# Patient Record
Sex: Male | Born: 1945 | Race: White | Hispanic: No | Marital: Married | Smoking: Current every day smoker
Health system: Southern US, Community
[De-identification: ages and names within clinical notes are randomized; demographics above are authoritative.]

## PROBLEM LIST (undated history)

## (undated) DIAGNOSIS — S42309A Unspecified fracture of shaft of humerus, unspecified arm, initial encounter for closed fracture: Secondary | ICD-10-CM

## (undated) DIAGNOSIS — S8290XA Unspecified fracture of unspecified lower leg, initial encounter for closed fracture: Secondary | ICD-10-CM

## (undated) DIAGNOSIS — S72001A Fracture of unspecified part of neck of right femur, initial encounter for closed fracture: Secondary | ICD-10-CM

## (undated) DIAGNOSIS — G8929 Other chronic pain: Secondary | ICD-10-CM

## (undated) HISTORY — DX: Fracture of unspecified part of neck of right femur, initial encounter for closed fracture: S72.001A

## (undated) HISTORY — PX: CHOLECYSTECTOMY: SHX55

## (undated) HISTORY — PX: ABDOMINAL SURGERY: SHX537

---

## 2004-12-02 ENCOUNTER — Ambulatory Visit (HOSPITAL_COMMUNITY): Admission: RE | Admit: 2004-12-02 | Discharge: 2004-12-02 | Payer: Self-pay | Admitting: Orthopedic Surgery

## 2005-02-02 ENCOUNTER — Ambulatory Visit (HOSPITAL_COMMUNITY): Admission: RE | Admit: 2005-02-02 | Discharge: 2005-02-03 | Payer: Self-pay | Admitting: Orthopedic Surgery

## 2014-04-23 ENCOUNTER — Encounter (HOSPITAL_COMMUNITY): Payer: Self-pay | Admitting: Emergency Medicine

## 2014-04-23 ENCOUNTER — Emergency Department (HOSPITAL_COMMUNITY): Payer: Medicare Other

## 2014-04-23 ENCOUNTER — Inpatient Hospital Stay (HOSPITAL_COMMUNITY)
Admission: EM | Admit: 2014-04-23 | Discharge: 2014-04-28 | DRG: 481 | Disposition: A | Discharge status: Skilled Nursing Facility | Payer: Medicare Other | Attending: General Surgery | Admitting: General Surgery

## 2014-04-23 DIAGNOSIS — IMO0002 Reserved for concepts with insufficient information to code with codable children: Secondary | ICD-10-CM

## 2014-04-23 DIAGNOSIS — Z789 Other specified health status: Secondary | ICD-10-CM

## 2014-04-23 DIAGNOSIS — S72143A Displaced intertrochanteric fracture of unspecified femur, initial encounter for closed fracture: Principal | ICD-10-CM | POA: Diagnosis present

## 2014-04-23 DIAGNOSIS — F411 Generalized anxiety disorder: Secondary | ICD-10-CM | POA: Diagnosis present

## 2014-04-23 DIAGNOSIS — T50905A Adverse effect of unspecified drugs, medicaments and biological substances, initial encounter: Secondary | ICD-10-CM

## 2014-04-23 DIAGNOSIS — M25559 Pain in unspecified hip: Secondary | ICD-10-CM | POA: Diagnosis present

## 2014-04-23 DIAGNOSIS — W11XXXA Fall on and from ladder, initial encounter: Secondary | ICD-10-CM | POA: Diagnosis present

## 2014-04-23 DIAGNOSIS — M1990 Unspecified osteoarthritis, unspecified site: Secondary | ICD-10-CM | POA: Diagnosis present

## 2014-04-23 DIAGNOSIS — G8929 Other chronic pain: Secondary | ICD-10-CM | POA: Diagnosis present

## 2014-04-23 DIAGNOSIS — D62 Acute posthemorrhagic anemia: Secondary | ICD-10-CM | POA: Diagnosis present

## 2014-04-23 DIAGNOSIS — S72009A Fracture of unspecified part of neck of unspecified femur, initial encounter for closed fracture: Secondary | ICD-10-CM

## 2014-04-23 DIAGNOSIS — S72001A Fracture of unspecified part of neck of right femur, initial encounter for closed fracture: Secondary | ICD-10-CM

## 2014-04-23 DIAGNOSIS — S72141A Displaced intertrochanteric fracture of right femur, initial encounter for closed fracture: Secondary | ICD-10-CM

## 2014-04-23 DIAGNOSIS — F17200 Nicotine dependence, unspecified, uncomplicated: Secondary | ICD-10-CM | POA: Diagnosis present

## 2014-04-23 DIAGNOSIS — R64 Cachexia: Secondary | ICD-10-CM | POA: Diagnosis present

## 2014-04-23 HISTORY — DX: Unspecified fracture of shaft of humerus, unspecified arm, initial encounter for closed fracture: S42.309A

## 2014-04-23 HISTORY — DX: Unspecified fracture of unspecified lower leg, initial encounter for closed fracture: S82.90XA

## 2014-04-23 HISTORY — DX: Other chronic pain: G89.29

## 2014-04-23 LAB — I-STAT CHEM 8, ED
BUN: 13 mg/dL (ref 6–23)
CHLORIDE: 99 meq/L (ref 96–112)
CREATININE: 0.9 mg/dL (ref 0.50–1.35)
Calcium, Ion: 1.2 mmol/L (ref 1.13–1.30)
Glucose, Bld: 141 mg/dL — ABNORMAL HIGH (ref 70–99)
HCT: 35 % — ABNORMAL LOW (ref 39.0–52.0)
Hemoglobin: 11.9 g/dL — ABNORMAL LOW (ref 13.0–17.0)
POTASSIUM: 4 meq/L (ref 3.7–5.3)
SODIUM: 136 meq/L — AB (ref 137–147)
TCO2: 26 mmol/L (ref 0–100)

## 2014-04-23 LAB — COMPREHENSIVE METABOLIC PANEL
ALT: 23 U/L (ref 0–53)
ANION GAP: 16 — AB (ref 5–15)
AST: 28 U/L (ref 0–37)
Albumin: 3.1 g/dL — ABNORMAL LOW (ref 3.5–5.2)
Alkaline Phosphatase: 140 U/L — ABNORMAL HIGH (ref 39–117)
BUN: 13 mg/dL (ref 6–23)
CHLORIDE: 97 meq/L (ref 96–112)
CO2: 24 meq/L (ref 19–32)
CREATININE: 0.87 mg/dL (ref 0.50–1.35)
Calcium: 8.7 mg/dL (ref 8.4–10.5)
GFR calc Af Amer: >90 mL/min (ref >90)
GFR calc non Af Amer: 87 mL/min — ABNORMAL LOW (ref >90)
Glucose, Bld: 140 mg/dL — ABNORMAL HIGH (ref 70–99)
Potassium: 4.1 mEq/L (ref 3.7–5.3)
Sodium: 137 mEq/L (ref 137–147)
Total Bilirubin: <0.2 mg/dL — ABNORMAL LOW (ref 0.3–1.2)
Total Protein: 6.9 g/dL (ref 6.0–8.3)

## 2014-04-23 LAB — CBC
HEMATOCRIT: 31.8 % — AB (ref 39.0–52.0)
HEMOGLOBIN: 10 g/dL — AB (ref 13.0–17.0)
MCH: 29.4 pg (ref 26.0–34.0)
MCHC: 31.4 g/dL (ref 30.0–36.0)
MCV: 93.5 fL (ref 78.0–100.0)
PLATELETS: 361 10*3/uL (ref 150–400)
RBC: 3.4 MIL/uL — AB (ref 4.22–5.81)
RDW: 14.8 % (ref 11.5–15.5)
WBC: 15.3 10*3/uL — AB (ref 4.0–10.5)

## 2014-04-23 LAB — ABO/RH: ABO/RH(D): O POS

## 2014-04-23 LAB — ETHANOL: Alcohol, Ethyl (B): <11 mg/dL (ref 0–11)

## 2014-04-23 LAB — MRSA PCR SCREENING: MRSA BY PCR: NEGATIVE

## 2014-04-23 LAB — PROTIME-INR
INR: 1.1 (ref 0.00–1.49)
Prothrombin Time: 14.2 seconds (ref 11.6–15.2)

## 2014-04-23 LAB — CDS SEROLOGY

## 2014-04-23 MED ORDER — PANTOPRAZOLE SODIUM 40 MG IV SOLR
40.0000 mg | Freq: Every day | INTRAVENOUS | Status: DC
  Administered 2014-04-23: 40 mg via INTRAVENOUS
  Filled 2014-04-23 (×2): qty 40

## 2014-04-23 MED ORDER — ONDANSETRON HCL 4 MG PO TABS: 4.0000 mg | ORAL_TABLET | Freq: Four times a day (QID) | ORAL | Status: DC | PRN

## 2014-04-23 MED ORDER — MORPHINE SULFATE 2 MG/ML IJ SOLN
2.0000 mg | INTRAMUSCULAR | Status: DC | PRN
  Administered 2014-04-23: 4 mg via INTRAVENOUS
  Filled 2014-04-23: qty 2

## 2014-04-23 MED ORDER — ACETAMINOPHEN 325 MG PO TABS: 650.0000 mg | ORAL_TABLET | ORAL | Status: DC | PRN

## 2014-04-23 MED ORDER — IOHEXOL 300 MG/ML  SOLN
80.0000 mL | Freq: Once | INTRAMUSCULAR | Status: AC | PRN
  Administered 2014-04-23: 80 mL via INTRAVENOUS

## 2014-04-23 MED ORDER — HYDROMORPHONE HCL PF 1 MG/ML IJ SOLN
0.5000 mg | Freq: Once | INTRAMUSCULAR | Status: AC
  Administered 2014-04-23: 0.5 mg via INTRAVENOUS
  Filled 2014-04-23: qty 1

## 2014-04-23 MED ORDER — PANTOPRAZOLE SODIUM 40 MG PO TBEC: 40.0000 mg | DELAYED_RELEASE_TABLET | Freq: Every day | ORAL | Status: DC

## 2014-04-23 MED ORDER — MORPHINE SULFATE 4 MG/ML IJ SOLN: 4.0000 mg | INTRAMUSCULAR | Status: DC | PRN

## 2014-04-23 MED ORDER — FENTANYL CITRATE 0.05 MG/ML IJ SOLN
50.0000 ug | Freq: Once | INTRAMUSCULAR | Status: AC
  Administered 2014-04-23: 50 ug via INTRAVENOUS
  Filled 2014-04-23: qty 2

## 2014-04-23 MED ORDER — LORAZEPAM 2 MG/ML IJ SOLN
1.0000 mg | Freq: Three times a day (TID) | INTRAMUSCULAR | Status: DC | PRN
  Administered 2014-04-24 (×2): 1 mg via INTRAVENOUS
  Filled 2014-04-23 (×2): qty 1

## 2014-04-23 MED ORDER — MORPHINE SULFATE 2 MG/ML IJ SOLN: 2.0000 mg | INTRAMUSCULAR | Status: DC | PRN

## 2014-04-23 MED ORDER — DEXTROSE 5 % IV SOLN
500.0000 mg | Freq: Four times a day (QID) | INTRAVENOUS | Status: DC | PRN
  Filled 2014-04-23: qty 5

## 2014-04-23 MED ORDER — ACETAMINOPHEN 10 MG/ML IV SOLN
1000.0000 mg | Freq: Four times a day (QID) | INTRAVENOUS | Status: DC
  Administered 2014-04-23 – 2014-04-24 (×2): 1000 mg via INTRAVENOUS
  Filled 2014-04-23 (×4): qty 100

## 2014-04-23 MED ORDER — ONDANSETRON HCL 4 MG/2ML IJ SOLN: 4.0000 mg | Freq: Four times a day (QID) | INTRAMUSCULAR | Status: DC | PRN

## 2014-04-23 MED ORDER — CEFAZOLIN SODIUM-DEXTROSE 2-3 GM-% IV SOLR
2.0000 g | Freq: Once | INTRAVENOUS | Status: AC
  Administered 2014-04-24: 2 g via INTRAVENOUS
  Filled 2014-04-23: qty 50

## 2014-04-23 MED ORDER — MORPHINE SULFATE 2 MG/ML IJ SOLN
2.0000 mg | INTRAMUSCULAR | Status: DC | PRN
  Administered 2014-04-23 – 2014-04-24 (×2): 4 mg via INTRAVENOUS
  Administered 2014-04-24: 2 mg via INTRAVENOUS
  Administered 2014-04-24: 4 mg via INTRAVENOUS
  Filled 2014-04-23 (×4): qty 2

## 2014-04-23 MED ORDER — SODIUM CHLORIDE 0.9 % IV SOLN
INTRAVENOUS | Status: DC
  Administered 2014-04-23 – 2014-04-27 (×2): via INTRAVENOUS

## 2014-04-23 NOTE — ED Notes (Signed)
Pt fell 6-7 feet off of a ladder and fell on R hip.  Denies LOC.  Found 15 min after fall.  A&O x4.  126/88, 78 hr, 98% RA

## 2014-04-23 NOTE — ED Notes (Signed)
PA Ainsley Spinner with ortho surgery at bedside.

## 2014-04-23 NOTE — ED Notes (Signed)
Report attempted 

## 2014-04-23 NOTE — ED Notes (Signed)
Surgeon Dr. Donne Hazel at bedside.

## 2014-04-23 NOTE — Consult Note (Signed)
Orthopaedic Trauma Service (OTS)  Reason for Consult: right intertrochanteric hip fracture Referring Physician: Freddi Che, M.D., ED resident, Dr. Jeneen Rinks attending  PCP: Dr. Jenita Seashore   HPI: Russell Mendoza is an 68 y.o. white male who sustained a 7 foot fall off of a ladder earlier this evening. Patient states that he locked himself out of the house and was attempting to gain access. Patient lost his footing on a ladder and fell to the ground below. He landed on her. He attempted to roll out of the fall but ended up landing on his right hip. Patient had severe pain in his right hip and was brought to [place] for evaluation. Patient was found to have a right intertrochanteric femur fracture. He is also several weeks status post repair of what sounds to be gastric ulcers at [facility]. Patient will be seen and evaluated by the trauma service as well. There is also a tear in the ascending aorta/aortic arch for which vascular surgery has been consulted.   Currently patient is resting in B17. Complains of severe right hip pain and right knee soreness. Denies any numbness or tingling to his lower extremities. Denies any injuries elsewhere. Specifically denies chest pain or shortness of breath. No lightheadedness or dizziness. Patient does need to void.  Patient was not on any special diet prior to his admission.   Patient is on oxycodone for chronic pain in his been on this for about 11 years. States he has chronic arthritis   Past Medical History  Diagnosis Date  . Broken arm   . Broken leg     Past Surgical History  Procedure Laterality Date  . Abdominal surgery      History reviewed. No pertinent family history.  Social History:  reports that he has been smoking Cigarettes.  He has been smoking about 0.50 packs per day. He does not have any smokeless tobacco history on file. He reports that he does not drink alcohol or use illicit drugs.  Allergies: No Known Allergies  Medications:    Oxycodone  Xanax   Results for orders placed during the hospital encounter of 04/23/14 (from the past 48 hour(s))  CDS SEROLOGY     Status: None   Collection Time    04/23/14  5:43 PM      Result Value Ref Range   CDS serology specimen       Value: SPECIMEN WILL BE HELD FOR 14 DAYS IF TESTING IS REQUIRED  COMPREHENSIVE METABOLIC PANEL     Status: Abnormal   Collection Time    04/23/14  5:43 PM      Result Value Ref Range   Sodium 137  137 - 147 mEq/L   Potassium 4.1  3.7 - 5.3 mEq/L   Chloride 97  96 - 112 mEq/L   CO2 24  19 - 32 mEq/L   Glucose, Bld 140 (*) 70 - 99 mg/dL   BUN 13  6 - 23 mg/dL   Creatinine, Ser 0.87  0.50 - 1.35 mg/dL   Calcium 8.7  8.4 - 10.5 mg/dL   Total Protein 6.9  6.0 - 8.3 g/dL   Albumin 3.1 (*) 3.5 - 5.2 g/dL   AST 28  0 - 37 U/L   ALT 23  0 - 53 U/L   Alkaline Phosphatase 140 (*) 39 - 117 U/L   Total Bilirubin <0.2 (*) 0.3 - 1.2 mg/dL   GFR calc non Af Amer 87 (*) >90 mL/min   GFR calc Af Amer >  90  >90 mL/min   Comment: (NOTE)     The eGFR has been calculated using the CKD EPI equation.     This calculation has not been validated in all clinical situations.     eGFR's persistently <90 mL/min signify possible Chronic Kidney     Disease.   Anion gap 16 (*) 5 - 15  CBC     Status: Abnormal   Collection Time    04/23/14  5:43 PM      Result Value Ref Range   WBC 15.3 (*) 4.0 - 10.5 K/uL   RBC 3.40 (*) 4.22 - 5.81 MIL/uL   Hemoglobin 10.0 (*) 13.0 - 17.0 g/dL   HCT 31.8 (*) 39.0 - 52.0 %   MCV 93.5  78.0 - 100.0 fL   MCH 29.4  26.0 - 34.0 pg   MCHC 31.4  30.0 - 36.0 g/dL   RDW 14.8  11.5 - 15.5 %   Platelets 361  150 - 400 K/uL  ETHANOL     Status: None   Collection Time    04/23/14  5:43 PM      Result Value Ref Range   Alcohol, Ethyl (B) <11  0 - 11 mg/dL   Comment:            LOWEST DETECTABLE LIMIT FOR     SERUM ALCOHOL IS 11 mg/dL     FOR MEDICAL PURPOSES ONLY  PROTIME-INR     Status: None   Collection Time    04/23/14  5:43 PM       Result Value Ref Range   Prothrombin Time 14.2  11.6 - 15.2 seconds   INR 1.10  0.00 - 1.49  TYPE AND SCREEN     Status: None   Collection Time    04/23/14  5:43 PM      Result Value Ref Range   ABO/RH(D) O POS     Antibody Screen NEG     Sample Expiration 04/26/2014    ABO/RH     Status: None   Collection Time    04/23/14  5:43 PM      Result Value Ref Range   ABO/RH(D) O POS    I-STAT CHEM 8, ED     Status: Abnormal   Collection Time    04/23/14  5:56 PM      Result Value Ref Range   Sodium 136 (*) 137 - 147 mEq/L   Potassium 4.0  3.7 - 5.3 mEq/L   Chloride 99  96 - 112 mEq/L   BUN 13  6 - 23 mg/dL   Creatinine, Ser 0.90  0.50 - 1.35 mg/dL   Glucose, Bld 141 (*) 70 - 99 mg/dL   Calcium, Ion 1.20  1.13 - 1.30 mmol/L   TCO2 26  0 - 100 mmol/L   Hemoglobin 11.9 (*) 13.0 - 17.0 g/dL   HCT 35.0 (*) 39.0 - 52.0 %    Dg Chest 1 View  04/23/2014   CLINICAL DATA:  RIGHT hip fracture post fall  EXAM: CHEST - 1 VIEW  COMPARISON:  03/05/2014  FINDINGS: Normal heart size, mediastinal contours, and pulmonary vascularity.  Skin folds project over RIGHT chest.  Lungs appear minimally hyperinflated but clear.  No pleural effusion or pneumothorax.  Bones demineralized.  IMPRESSION: No acute abnormalities.   Electronically Signed   By: Lavonia Dana M.D.   On: 04/23/2014 19:33   Dg Hip Complete Right  04/23/2014   CLINICAL DATA:  RIGHT hip  fracture post fall  EXAM: RIGHT HIP - COMPLETE 2+ VIEW  COMPARISON:  None  FINDINGS: Excreted contrast material within urinary bladder from preceding CT, with indentation of the bladder base suggesting enlarged prostate gland.  Comminuted displaced intertrochanteric fracture RIGHT femur with varus angulation.  Diffuse osseous demineralization.  Symmetric hip and SI joints.  Degenerative disc disease changes lower lumbar spine.  No additional fracture, dislocation or bone destruction.  IMPRESSION: Comminuted displaced and angulated intertrochanteric fracture  RIGHT femur.   Electronically Signed   By: Lavonia Dana M.D.   On: 04/23/2014 19:31   Ct Head Wo Contrast  04/23/2014   CLINICAL DATA:  Fall from ladder.  EXAM: CT HEAD WITHOUT CONTRAST  CT CERVICAL SPINE WITHOUT CONTRAST  TECHNIQUE: Multidetector CT imaging of the head and cervical spine was performed following the standard protocol without intravenous contrast. Multiplanar CT image reconstructions of the cervical spine were also generated.  COMPARISON:  PET-CT from 03/05/2014.  FINDINGS: CT HEAD FINDINGS  There is no evidence for acute hemorrhage, hydrocephalus, mass lesion, or abnormal extra-axial fluid collection. No definite CT evidence for acute infarction. Diffuse loss of parenchymal volume is consistent with atrophy. Patchy low attenuation in the deep hemispheric and periventricular white matter is nonspecific, but likely reflects chronic microvascular ischemic demyelination.The visualized paranasal sinuses and mastoid air cells are clear. No evidence for skull fracture.  CT CERVICAL SPINE FINDINGS  Imaging was obtained from the skullbase through the T1 to interspace. There is no fracture. Loss of disc height is seen at C5-6 and C6-7. Trace anterolisthesis of C7 on T1 is compatible with the degree of facet degeneration at that level. No prevertebral soft tissue swelling. Cervical lordosis is preserved.  IMPRESSION: No acute intracranial abnormality.  Atrophy with chronic small vessel white matter ischemic demyelination.  No evidence for cervical spine fracture with multilevel degenerative changes.   Electronically Signed   By: Misty Stanley M.D.   On: 04/23/2014 18:52   Ct Chest W Contrast  04/23/2014   CLINICAL DATA:  Pain post trauma  EXAM: CT CHEST, ABDOMEN, AND PELVIS WITH CONTRAST  TECHNIQUE: Multidetector CT imaging of the chest, abdomen and pelvis was performed following the standard protocol during bolus administration of intravenous contrast.  CONTRAST:  58m OMNIPAQUE IOHEXOL 300 MG/ML  SOLN   COMPARISON:  CT abdomen and pelvis Mar 05, 2014  FINDINGS: CT CHEST FINDINGS  There is underlying emphysematous change. There is no edema or consolidation. No pneumothorax. No parenchymal lung contusion is appreciable.  There is no demonstrable mediastinal hematoma. There is atherosclerotic change in the aorta. There is a short segment tear in the aortic arch which is near but not directly involving the right and left common carotid arteries. There is mild atherosclerotic change at the origins of these vessels. There is somewhat greater atherosclerotic calcification at the origin of the left subclavian artery. No great vessel dissection is seen. There is no thoracic aortic aneurysm. There is no major vessel pulmonary embolus.  Thyroid appears normal. No fractures are noted in the thoracic region.  CT ABDOMEN AND PELVIS FINDINGS  There is a small amount of perihepatic fluid. No liver laceration or rupture is demonstrated on this study. The liver has a mildly inhomogeneous appearance, most likely due to hepatic steatosis. There may be a degree of underlying parenchymal disease as well in the liver. No liver masses are appreciable. There is no biliary duct dilatation. Gallbladder wall is not thickened.  Spleen appears intact without laceration or rupture. No  splenic lesions are identified.  Pancreas and adrenals appear normal. There is a cyst arising from the upper pole of the right kidney measuring 3.2 x 2.8 cm. There is a cyst arising mid left kidney laterally measuring 2.6 by 1.9 cm. There is a cyst in the posterior mid left kidney measuring 1.0 by 0.8 cm. There is a tiny cyst arising from the anterior lower pole right kidney. There is no hydronephrosis or noncystic renal mass on either side. There is no evidence of renal contusion or perinephric fluid. No renal laceration or rupture. No contrast extravasation.  There is dilatation of the distal abdominal aorta with a maximum transverse diameter of 3.7 x 3.5 cm.  There is peripheral thrombus in the aorta. There is no evidence of periaortic fluid or aortic disruption.  In the pelvis, the urinary bladder is midline with normal wall thickness. Prostate is prominent. There is no pelvic mass or fluid collection. Appendix appears normal.  There is fluid throughout most of the small bowel. There is no small bowel obstruction. No free air or portal venous air apparent.  There is thickening of bowel wall in multiple loops of jejunum. There is no surrounding mesenteric thickening, however. There is no adenopathy or abscess in the an or pelvis. The previously noted ascites in the pelvis has resolved.  There is a comminuted intertrochanteric femur fracture on the right with varus angulation at the fracture site. There is impaction at the fracture site as well. There is soft tissue hemorrhage surrounding this comminuted fracture of the right proximal femur region. There is degenerative change in the lumbar spine. No other fractures are appreciable.  IMPRESSION: CT chest: There is a small tear in the aorta at the level of the arch which is not involving the great vessels. 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 There is underlying emphysema. No pneumothorax or lung contusion. No consolidation. No evidence of mediastinal hematoma. No fractures are identified in the thoracic region.  CT abdomen and pelvis: There is a comminuted intertrochanteric femur fracture on the right with varus angulation at the fracture site.  There is perihepatic fluid without other ascites. The patient had recent ascites following apparent bowel perforation on prior study. While it is possible that the fluid in the perihepatic region is secondary to the residual ascites seen at the time of the bowel obstruction, the localized nature of the current perihepatic fluid raises concern  for potential liver injury. No other liver lesion is appreciable. There is apparent hepatic steatosis. There may be underlying parenchymal liver disease is well.  Several loops of jejunum show wall thickening. This thickening could represent residua from previous bowel obstruction. The possibility of bowel injury must be of concern. This patient should be followed closely clinically given this possibility.  There is a small distal abdominal aortic aneurysm.  There are renal cysts bilaterally. No noncystic renal masses are identified.  These results were called by telephone at the time of interpretation on 04/23/2014 at 7:08 pm to Dr. Freddi Che , who verbally acknowledged these results.   Electronically Signed   By: Lowella Grip M.D.   On: 04/23/2014 19:08   Ct Cervical Spine Wo Contrast  04/23/2014   CLINICAL DATA:  Fall from ladder.  EXAM: CT HEAD WITHOUT CONTRAST  CT CERVICAL SPINE WITHOUT CONTRAST  TECHNIQUE: Multidetector CT imaging of the head and cervical  spine was performed following the standard protocol without intravenous contrast. Multiplanar CT image reconstructions of the cervical spine were also generated.  COMPARISON:  PET-CT from 03/05/2014.  FINDINGS: CT HEAD FINDINGS  There is no evidence for acute hemorrhage, hydrocephalus, mass lesion, or abnormal extra-axial fluid collection. No definite CT evidence for acute infarction. Diffuse loss of parenchymal volume is consistent with atrophy. Patchy low attenuation in the deep hemispheric and periventricular white matter is nonspecific, but likely reflects chronic microvascular ischemic demyelination.The visualized paranasal sinuses and mastoid air cells are clear. No evidence for skull fracture.  CT CERVICAL SPINE FINDINGS  Imaging was obtained from the skullbase through the T1 to interspace. There is no fracture. Loss of disc height is seen at C5-6 and C6-7. Trace anterolisthesis of C7 on T1 is compatible with the degree of facet degeneration  at that level. No prevertebral soft tissue swelling. Cervical lordosis is preserved.  IMPRESSION: No acute intracranial abnormality.  Atrophy with chronic small vessel white matter ischemic demyelination.  No evidence for cervical spine fracture with multilevel degenerative changes.   Electronically Signed   By: Misty Stanley M.D.   On: 04/23/2014 18:52   Ct Abdomen Pelvis W Contrast  04/23/2014   CLINICAL DATA:  Pain post trauma  EXAM: CT CHEST, ABDOMEN, AND PELVIS WITH CONTRAST  TECHNIQUE: Multidetector CT imaging of the chest, abdomen and pelvis was performed following the standard protocol during bolus administration of intravenous contrast.  CONTRAST:  48m OMNIPAQUE IOHEXOL 300 MG/ML  SOLN  COMPARISON:  CT abdomen and pelvis Mar 05, 2014  FINDINGS: CT CHEST FINDINGS  There is underlying emphysematous change. There is no edema or consolidation. No pneumothorax. No parenchymal lung contusion is appreciable.  There is no demonstrable mediastinal hematoma. There is atherosclerotic change in the aorta. There is a short segment tear in the aortic arch which is near but not directly involving the right and left common carotid arteries. There is mild atherosclerotic change at the origins of these vessels. There is somewhat greater atherosclerotic calcification at the origin of the left subclavian artery. No great vessel dissection is seen. There is no thoracic aortic aneurysm. There is no major vessel pulmonary embolus.  Thyroid appears normal. No fractures are noted in the thoracic region.  CT ABDOMEN AND PELVIS FINDINGS  There is a small amount of perihepatic fluid. No liver laceration or rupture is demonstrated on this study. The liver has a mildly inhomogeneous appearance, most likely due to hepatic steatosis. There may be a degree of underlying parenchymal disease as well in the liver. No liver masses are appreciable. There is no biliary duct dilatation. Gallbladder wall is not thickened.  Spleen appears  intact without laceration or rupture. No splenic lesions are identified.  Pancreas and adrenals appear normal. There is a cyst arising from the upper pole of the right kidney measuring 3.2 x 2.8 cm. There is a cyst arising mid left kidney laterally measuring 2.6 by 1.9 cm. There is a cyst in the posterior mid left kidney measuring 1.0 by 0.8 cm. There is a tiny cyst arising from the anterior lower pole right kidney. There is no hydronephrosis or noncystic renal mass on either side. There is no evidence of renal contusion or perinephric fluid. No renal laceration or rupture. No contrast extravasation.  There is dilatation of the distal abdominal aorta with a maximum transverse diameter of 3.7 x 3.5 cm. There is peripheral thrombus in the aorta. There is no evidence of periaortic fluid or aortic disruption.  In the pelvis, the urinary bladder is midline with normal wall thickness. Prostate is prominent. There is no pelvic mass or fluid collection. Appendix appears normal.  There is fluid throughout most of the small bowel. There is no small bowel obstruction. No free air or portal venous air apparent.  There is thickening of bowel wall in multiple loops of jejunum. There is no surrounding mesenteric thickening, however. There is no adenopathy or abscess in the an or pelvis. The previously noted ascites in the pelvis has resolved.  There is a comminuted intertrochanteric femur fracture on the right with varus angulation at the fracture site. There is impaction at the fracture site as well. There is soft tissue hemorrhage surrounding this comminuted fracture of the right proximal femur region. There is degenerative change in the lumbar spine. No other fractures are appreciable.  IMPRESSION: CT chest: There is a small tear in the aorta at the level of the arch which is not involving the great vessels. There is atherosclerotic change in aorta and at the origins of the great vessels, primarily at the origin of the left  subclavian artery where there is 50-60% diameter stenosis. There is underlying emphysema. No pneumothorax or lung contusion. No consolidation. No evidence of mediastinal hematoma. No fractures are identified in the thoracic region.  CT abdomen and pelvis: There is a comminuted intertrochanteric femur fracture on the right with varus angulation at the fracture site.  There is perihepatic fluid without other ascites. The patient had recent ascites following apparent bowel perforation on prior study. While it is possible that the fluid in the perihepatic region is secondary to the residual ascites seen at the time of the bowel obstruction, the localized nature of the current perihepatic fluid raises concern for potential liver injury. No other liver lesion is appreciable. There is apparent hepatic steatosis. There may be underlying parenchymal liver disease is well.  Several loops of jejunum show wall thickening. This thickening could represent residua from previous bowel obstruction. The possibility of bowel injury must be of concern. This patient should be followed closely clinically given this possibility.  There is a small distal abdominal aortic aneurysm.  There are renal cysts bilaterally. No noncystic renal masses are identified.  These results were called by telephone at the time of interpretation on 04/23/2014 at 7:08 pm to Dr. Freddi Che , who verbally acknowledged these results.   Electronically Signed   By: Lowella Grip M.D.   On: 04/23/2014 19:08    Review of Systems  Constitutional: Negative for fever and chills.  Respiratory: Negative for shortness of breath and wheezing.   Cardiovascular: Negative for chest pain and palpitations.  Gastrointestinal: Negative for nausea, vomiting and abdominal pain.       Midline scar from previous surgery. Partially healed   Musculoskeletal:       R hip pain  Neurological: Negative for tingling, sensory change and headaches.  Psychiatric/Behavioral: The  patient is nervous/anxious.    Blood pressure 125/73, pulse 75, temperature 98.5 F (36.9 C), resp. rate 15, height 5' 5"  (1.651 m), weight 63.504 kg (140 lb), SpO2 100.00%. Physical Exam  Constitutional: Vital signs are normal. He appears well-developed and well-nourished. He is cooperative. No distress.  Thin build   HENT:  Head: Normocephalic and atraumatic.  Mouth/Throat: Oropharynx is clear and moist and mucous membranes are normal.  Neck: Normal range of motion. No spinous process tenderness and no muscular tenderness present.  Cardiovascular: Normal rate, regular rhythm, S1 normal and S2 normal.  Respiratory:  Anterior fields clear   GI:  Soft, + BS Midline scar noted, middle of scar is not healed but + granulation tissue. No purulence or signs of infection   Musculoskeletal:  Pelvis    No instability     No pain with mobilization   Right lower extremity  Inspection:   R leg is shortened and externally rotated    Knee, lower leg, ankle and foot unremarkable  Bony eval:    TTP R hip      TTP R knee     Ankle and foot nontender Soft tissue:    No open wounds or lesions    No ecchymosis     No significant swelling of R leg  ROM:    Fair ROM R ankle    ROM of R knee and hip not performed  Sensation:    DPN, SPN, TN, fem nv sensation intact Motor:    EHL, FHL, AT, PT, peroneals, gastroc motor intact    + quad set   Vascular:   2+DP and PT pulse   Ext warm   Good distal perfusion    Compartments soft    Neurological: He is alert.    Assessment/Plan:  68 y/o male s/p fall off ladder  1. Fall of ladder  2. R IT hip fracture  Will place in bucks traction for comfort tonight  If it does not help with pain, it can be taken off  Place pillow under knee for gentle knee flexion and hip flexion as well  Ice   OR tomorrow for IMN R hip   Bed rest for now  WBAT after surgery   PT/OT consults after surgery   3. Hx of abdominal surgery   Per TS/general  surgery  4. Aortic injury   Pt stable at current time, no CP/SOB. Pt is not tachycardic and he his normotensive  Vascular surgery consult pending per report  5. Pain control  Chronic pain issues may complicate a bit   Titrate pain meds accordingly   6. DVT/PE prophylaxis  SCDs for now  7. FEN  NPO after MN  Ok to place foley if too difficult for pt to void using urinal   8. Dispo  OR tomorrow for IMN R hip if medically stable  Jari Pigg, PA-C [facility] [phone] (P) 04/23/2014, 8:48 PM

## 2014-04-23 NOTE — Progress Notes (Signed)
Orthopedic Tech Progress Note Patient Details:  Russell Mendoza 1946/02/17 [id number]  Musculoskeletal Traction Type of Traction: Bucks Skin Traction Traction Location: bucks traction with 10lbs of weight is applied to the right lower extremity.    Ashok Cordia 04/23/2014, 10:24 PM

## 2014-04-23 NOTE — Consult Note (Signed)
Cardiothoracic Surgery  Asked by Dr. Donne Hazel to evaluate his CTA of the chest that was read by radiology as showing a limited tear of the aortic arch. The patient fell about 6-7 ft off a ladder this evening and landed on his right hip with severe pain the hip. He suffered a right intertrochanteric femur fracture. He has a CT scan of the chest and abdomen. The CT of the chest was reviewed by me. There is a short smudge across the aortic arch that looks like an artifact to me. There is no break in the continuity of the aortic wall. There is some calcification of the aortic wall and origins of the great vessels and this is not displaced. There is no fluid around the aorta. This would also be a very unusual location for a traumatic aortic injury and I don't think that a 6-7 ft fall off a ladder onto his hip is likely to cause an aortic injury. I would treat this as an artifact and for completeness you could repeat a CTA before discharge as the patient is recovering.

## 2014-04-23 NOTE — ED Provider Notes (Signed)
CSN: [id number]     Arrival date & time 04/23/14  1718 History   First MD Initiated Contact with Patient 04/23/14 1725     Chief Complaint  Patient presents with  . Fall     (Consider location/radiation/quality/duration/timing/severity/associated sxs/prior Treatment) Patient is a 68 y.o. male presenting with fall and trauma.  Fall Pertinent negatives include no abdominal pain, chest pain, diaphoresis, nausea, neck pain, numbness, vomiting or weakness.  Trauma Mechanism of injury: fall Arrived directly from scene: yes   Fall:      Fall occurred: from a ladder      Height of fall: 7      Impact surface: dirt      Point of impact: R hip.  EMS/PTA data:      Bystander interventions: none      Ambulatory at scene: no      Responsiveness: alert      Loss of consciousness: no      Airway interventions: none  Current symptoms:      Pain quality: sharp      Pain timing: constant      Associated symptoms:            Reports back pain (lower back).            Denies abdominal pain, chest pain, loss of consciousness, nausea, neck pain and vomiting.    Past Medical History  Diagnosis Date  . Broken arm   . Broken leg   . Chronic pain    Past Surgical History  Procedure Laterality Date  . Abdominal surgery     History reviewed. No pertinent family history. History  Substance Use Topics  . Smoking status: Current Every Day Smoker -- 0.50 packs/day    Types: Cigarettes  . Smokeless tobacco: Not on file  . Alcohol Use: No    Review of Systems  Constitutional: Negative for diaphoresis.  HENT: Negative for dental problem, facial swelling and trouble swallowing.   Eyes: Negative for pain.  Respiratory: Negative for chest tightness.   Cardiovascular: Negative for chest pain.  Gastrointestinal: Negative for nausea, vomiting and abdominal pain.  Genitourinary: Negative for penile pain and testicular pain.  Musculoskeletal: Positive for back pain (lower back). Negative for  neck pain.  Skin: Negative for wound.  Neurological: Negative for loss of consciousness, syncope, weakness, light-headedness and numbness.      Allergies  Review of patient's allergies indicates no known allergies.  Home Medications   Prior to Admission medications   Medication Sig Start Date End Date Taking? Authorizing Provider  ALPRAZolam Duanne Moron) 1 MG tablet Take 1 mg by mouth 3 (three) times daily.   Yes Historical Provider, MD  aspirin EC 325 MG tablet Take 325 mg by mouth daily.   Yes Historical Provider, MD  famotidine (PEPCID) 20 MG tablet Take 20 mg by mouth 2 (two) times daily.   Yes Historical Provider, MD  lisinopril (PRINIVIL,ZESTRIL) 5 MG tablet Take 5 mg by mouth daily.   Yes Historical Provider, MD  oxycodone (ROXICODONE) 30 MG immediate release tablet Take 30 mg by mouth 3 (three) times daily.   Yes Historical Provider, MD  sertraline (ZOLOFT) 25 MG tablet Take 25 mg by mouth at bedtime.   Yes Historical Provider, MD   BP 110/65  Pulse 74  Temp(Src) 98.5 F (36.9 C)  Resp 12  Ht 5\' 5"  (1.651 m)  Wt 140 lb (63.504 kg)  BMI 23.30 kg/m2  SpO2 97% Physical Exam  Constitutional: He is  oriented to person, place, and time. He appears well-developed and well-nourished. No distress.  HENT:  Head: Normocephalic and atraumatic.  Eyes: Pupils are equal, round, and reactive to light.  Neck: Normal range of motion.  Cardiovascular: Normal rate and regular rhythm.   Pulmonary/Chest: Effort normal and breath sounds normal. No respiratory distress. He exhibits no bony tenderness.  Abdominal: Soft. He exhibits no distension. There is no tenderness.  Recent abdominal surgery, dressing c/d/i  Musculoskeletal: Normal range of motion.       Right hip: He exhibits bony tenderness and deformity (R hip externally rotated, limb length shortening, pain on palpation). He exhibits no tenderness.       Cervical back: He exhibits no bony tenderness and no deformity.       Thoracic back:  He exhibits no bony tenderness and no deformity.       Lumbar back: He exhibits tenderness. He exhibits no bony tenderness and no deformity.  Neurological: He is alert and oriented to person, place, and time.  Skin: Skin is warm. He is not diaphoretic.    ED Course  Procedures (including critical care time) Labs Review Labs Reviewed  COMPREHENSIVE METABOLIC PANEL - Abnormal; Notable for the following:    Glucose, Bld 140 (*)    Albumin 3.1 (*)    Alkaline Phosphatase 140 (*)    Total Bilirubin <0.2 (*)    GFR calc non Af Amer 87 (*)    Anion gap 16 (*)    All other components within normal limits  CBC - Abnormal; Notable for the following:    WBC 15.3 (*)    RBC 3.40 (*)    Hemoglobin 10.0 (*)    HCT 31.8 (*)    All other components within normal limits  I-STAT CHEM 8, ED - Abnormal; Notable for the following:    Sodium 136 (*)    Glucose, Bld 141 (*)    Hemoglobin 11.9 (*)    HCT 35.0 (*)    All other components within normal limits  CDS SEROLOGY  ETHANOL  PROTIME-INR  VITAMIN D 1,25 DIHYDROXY  VITAMIN D 25 HYDROXY  TYPE AND SCREEN  ABO/RH    Imaging Review Dg Chest 1 View  04/23/2014   CLINICAL DATA:  RIGHT hip fracture post fall  EXAM: CHEST - 1 VIEW  COMPARISON:  03/05/2014  FINDINGS: Normal heart size, mediastinal contours, and pulmonary vascularity.  Skin folds project over RIGHT chest.  Lungs appear minimally hyperinflated but clear.  No pleural effusion or pneumothorax.  Bones demineralized.  IMPRESSION: No acute abnormalities.   Electronically Signed   By: Lavonia Dana M.D.   On: 04/23/2014 19:33   Dg Hip Complete Right  04/23/2014   CLINICAL DATA:  RIGHT hip fracture post fall  EXAM: RIGHT HIP - COMPLETE 2+ VIEW  COMPARISON:  None  FINDINGS: Excreted contrast material within urinary bladder from preceding CT, with indentation of the bladder base suggesting enlarged prostate gland.  Comminuted displaced intertrochanteric fracture RIGHT femur with varus angulation.   Diffuse osseous demineralization.  Symmetric hip and SI joints.  Degenerative disc disease changes lower lumbar spine.  No additional fracture, dislocation or bone destruction.  IMPRESSION: Comminuted displaced and angulated intertrochanteric fracture RIGHT femur.   Electronically Signed   By: Lavonia Dana M.D.   On: 04/23/2014 19:31   Ct Head Wo Contrast  04/23/2014   CLINICAL DATA:  Fall from ladder.  EXAM: CT HEAD WITHOUT CONTRAST  CT CERVICAL SPINE WITHOUT CONTRAST  TECHNIQUE:  Multidetector CT imaging of the head and cervical spine was performed following the standard protocol without intravenous contrast. Multiplanar CT image reconstructions of the cervical spine were also generated.  COMPARISON:  PET-CT from 03/05/2014.  FINDINGS: CT HEAD FINDINGS  There is no evidence for acute hemorrhage, hydrocephalus, mass lesion, or abnormal extra-axial fluid collection. No definite CT evidence for acute infarction. Diffuse loss of parenchymal volume is consistent with atrophy. Patchy low attenuation in the deep hemispheric and periventricular white matter is nonspecific, but likely reflects chronic microvascular ischemic demyelination.The visualized paranasal sinuses and mastoid air cells are clear. No evidence for skull fracture.  CT CERVICAL SPINE FINDINGS  Imaging was obtained from the skullbase through the T1 to interspace. There is no fracture. Loss of disc height is seen at C5-6 and C6-7. Trace anterolisthesis of C7 on T1 is compatible with the degree of facet degeneration at that level. No prevertebral soft tissue swelling. Cervical lordosis is preserved.  IMPRESSION: No acute intracranial abnormality.  Atrophy with chronic small vessel white matter ischemic demyelination.  No evidence for cervical spine fracture with multilevel degenerative changes.   Electronically Signed   By: Misty Stanley M.D.   On: 04/23/2014 18:52   Ct Chest W Contrast  04/23/2014   CLINICAL DATA:  Pain post trauma  EXAM: CT CHEST,  ABDOMEN, AND PELVIS WITH CONTRAST  TECHNIQUE: Multidetector CT imaging of the chest, abdomen and pelvis was performed following the standard protocol during bolus administration of intravenous contrast.  CONTRAST:  51mL OMNIPAQUE IOHEXOL 300 MG/ML  SOLN  COMPARISON:  CT abdomen and pelvis Mar 05, 2014  FINDINGS: CT CHEST FINDINGS  There is underlying emphysematous change. There is no edema or consolidation. No pneumothorax. No parenchymal lung contusion is appreciable.  There is no demonstrable mediastinal hematoma. There is atherosclerotic change in the aorta. There is a short segment tear in the aortic arch which is near but not directly involving the right and left common carotid arteries. There is mild atherosclerotic change at the origins of these vessels. There is somewhat greater atherosclerotic calcification at the origin of the left subclavian artery. No great vessel dissection is seen. There is no thoracic aortic aneurysm. There is no major vessel pulmonary embolus.  Thyroid appears normal. No fractures are noted in the thoracic region.  CT ABDOMEN AND PELVIS FINDINGS  There is a small amount of perihepatic fluid. No liver laceration or rupture is demonstrated on this study. The liver has a mildly inhomogeneous appearance, most likely due to hepatic steatosis. There may be a degree of underlying parenchymal disease as well in the liver. No liver masses are appreciable. There is no biliary duct dilatation. Gallbladder wall is not thickened.  Spleen appears intact without laceration or rupture. No splenic lesions are identified.  Pancreas and adrenals appear normal. There is a cyst arising from the upper pole of the right kidney measuring 3.2 x 2.8 cm. There is a cyst arising mid left kidney laterally measuring 2.6 by 1.9 cm. There is a cyst in the posterior mid left kidney measuring 1.0 by 0.8 cm. There is a tiny cyst arising from the anterior lower pole right kidney. There is no hydronephrosis or noncystic  renal mass on either side. There is no evidence of renal contusion or perinephric fluid. No renal laceration or rupture. No contrast extravasation.  There is dilatation of the distal abdominal aorta with a maximum transverse diameter of 3.7 x 3.5 cm. There is peripheral thrombus in the aorta. There is no  evidence of periaortic fluid or aortic disruption.  In the pelvis, the urinary bladder is midline with normal wall thickness. Prostate is prominent. There is no pelvic mass or fluid collection. Appendix appears normal.  There is fluid throughout most of the small bowel. There is no small bowel obstruction. No free air or portal venous air apparent.  There is thickening of bowel wall in multiple loops of jejunum. There is no surrounding mesenteric thickening, however. There is no adenopathy or abscess in the an or pelvis. The previously noted ascites in the pelvis has resolved.  There is a comminuted intertrochanteric femur fracture on the right with varus angulation at the fracture site. There is impaction at the fracture site as well. There is soft tissue hemorrhage surrounding this comminuted fracture of the right proximal femur region. There is degenerative change in the lumbar spine. No other fractures are appreciable.  IMPRESSION: CT chest: There is a small tear in the aorta at the level of the arch which is not involving the great vessels. 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 There is underlying emphysema. No pneumothorax or lung contusion. No consolidation. No evidence of mediastinal hematoma. No fractures are identified in the thoracic region.  CT abdomen and pelvis: There is a comminuted intertrochanteric femur fracture on the right with varus angulation at the fracture site.  There is perihepatic fluid without other ascites. The patient had recent ascites following  apparent bowel perforation on prior study. While it is possible that the fluid in the perihepatic region is secondary to the residual ascites seen at the time of the bowel obstruction, the localized nature of the current perihepatic fluid raises concern for potential liver injury. No other liver lesion is appreciable. There is apparent hepatic steatosis. There may be underlying parenchymal liver disease is well.  Several loops of jejunum show wall thickening. This thickening could represent residua from previous bowel obstruction. The possibility of bowel injury must be of concern. This patient should be followed closely clinically given this possibility.  There is a small distal abdominal aortic aneurysm.  There are renal cysts bilaterally. No noncystic renal masses are identified.  These results were called by telephone at the time of interpretation on 04/23/2014 at 7:08 pm to Dr. Freddi Che , who verbally acknowledged these results.   Electronically Signed   By: Lowella Grip M.D.   On: 04/23/2014 19:08   Ct Cervical Spine Wo Contrast  04/23/2014   CLINICAL DATA:  Fall from ladder.  EXAM: CT HEAD WITHOUT CONTRAST  CT CERVICAL SPINE WITHOUT CONTRAST  TECHNIQUE: Multidetector CT imaging of the head and cervical spine was performed following the standard protocol without intravenous contrast. Multiplanar CT image reconstructions of the cervical spine were also generated.  COMPARISON:  PET-CT from 03/05/2014.  FINDINGS: CT HEAD FINDINGS  There is no evidence for acute hemorrhage, hydrocephalus, mass lesion, or abnormal extra-axial fluid collection. No definite CT evidence for acute infarction. Diffuse loss of parenchymal volume is consistent with atrophy. Patchy low attenuation in the deep hemispheric and periventricular white matter is nonspecific, but likely reflects chronic microvascular ischemic demyelination.The visualized paranasal sinuses and mastoid air cells are clear. No evidence for skull fracture.   CT CERVICAL SPINE FINDINGS  Imaging was obtained from the skullbase through the T1 to interspace. There is no fracture. Loss of disc height is seen at C5-6 and  C6-7. Trace anterolisthesis of C7 on T1 is compatible with the degree of facet degeneration at that level. No prevertebral soft tissue swelling. Cervical lordosis is preserved.  IMPRESSION: No acute intracranial abnormality.  Atrophy with chronic small vessel white matter ischemic demyelination.  No evidence for cervical spine fracture with multilevel degenerative changes.   Electronically Signed   By: Misty Stanley M.D.   On: 04/23/2014 18:52   Ct Abdomen Pelvis W Contrast  04/23/2014   CLINICAL DATA:  Pain post trauma  EXAM: CT CHEST, ABDOMEN, AND PELVIS WITH CONTRAST  TECHNIQUE: Multidetector CT imaging of the chest, abdomen and pelvis was performed following the standard protocol during bolus administration of intravenous contrast.  CONTRAST:  72mL OMNIPAQUE IOHEXOL 300 MG/ML  SOLN  COMPARISON:  CT abdomen and pelvis Mar 05, 2014  FINDINGS: CT CHEST FINDINGS  There is underlying emphysematous change. There is no edema or consolidation. No pneumothorax. No parenchymal lung contusion is appreciable.  There is no demonstrable mediastinal hematoma. There is atherosclerotic change in the aorta. There is a short segment tear in the aortic arch which is near but not directly involving the right and left common carotid arteries. There is mild atherosclerotic change at the origins of these vessels. There is somewhat greater atherosclerotic calcification at the origin of the left subclavian artery. No great vessel dissection is seen. There is no thoracic aortic aneurysm. There is no major vessel pulmonary embolus.  Thyroid appears normal. No fractures are noted in the thoracic region.  CT ABDOMEN AND PELVIS FINDINGS  There is a small amount of perihepatic fluid. No liver laceration or rupture is demonstrated on this study. The liver has a mildly inhomogeneous  appearance, most likely due to hepatic steatosis. There may be a degree of underlying parenchymal disease as well in the liver. No liver masses are appreciable. There is no biliary duct dilatation. Gallbladder wall is not thickened.  Spleen appears intact without laceration or rupture. No splenic lesions are identified.  Pancreas and adrenals appear normal. There is a cyst arising from the upper pole of the right kidney measuring 3.2 x 2.8 cm. There is a cyst arising mid left kidney laterally measuring 2.6 by 1.9 cm. There is a cyst in the posterior mid left kidney measuring 1.0 by 0.8 cm. There is a tiny cyst arising from the anterior lower pole right kidney. There is no hydronephrosis or noncystic renal mass on either side. There is no evidence of renal contusion or perinephric fluid. No renal laceration or rupture. No contrast extravasation.  There is dilatation of the distal abdominal aorta with a maximum transverse diameter of 3.7 x 3.5 cm. There is peripheral thrombus in the aorta. There is no evidence of periaortic fluid or aortic disruption.  In the pelvis, the urinary bladder is midline with normal wall thickness. Prostate is prominent. There is no pelvic mass or fluid collection. Appendix appears normal.  There is fluid throughout most of the small bowel. There is no small bowel obstruction. No free air or portal venous air apparent.  There is thickening of bowel wall in multiple loops of jejunum. There is no surrounding mesenteric thickening, however. There is no adenopathy or abscess in the an or pelvis. The previously noted ascites in the pelvis has resolved.  There is a comminuted intertrochanteric femur fracture on the right with varus angulation at the fracture site. There is impaction at the fracture site as well. There is soft tissue hemorrhage surrounding this comminuted fracture of the right  proximal femur region. There is degenerative change in the lumbar spine. No other fractures are  appreciable.  IMPRESSION: CT chest: There is a small tear in the aorta at the level of the arch which is not involving the great vessels. 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 There is underlying emphysema. No pneumothorax or lung contusion. No consolidation. No evidence of mediastinal hematoma. No fractures are identified in the thoracic region.  CT abdomen and pelvis: There is a comminuted intertrochanteric femur fracture on the right with varus angulation at the fracture site.  There is perihepatic fluid without other ascites. The patient had recent ascites following apparent bowel perforation on prior study. While it is possible that the fluid in the perihepatic region is secondary to the residual ascites seen at the time of the bowel obstruction, the localized nature of the current perihepatic fluid raises concern for potential liver injury. No other liver lesion is appreciable. There is apparent hepatic steatosis. There may be underlying parenchymal liver disease is well.  Several loops of jejunum show wall thickening. This thickening could represent residua from previous bowel obstruction. The possibility of bowel injury must be of concern. This patient should be followed closely clinically given this possibility.  There is a small distal abdominal aortic aneurysm.  There are renal cysts bilaterally. No noncystic renal masses are identified.  These results were called by telephone at the time of interpretation on 04/23/2014 at 7:08 pm to Dr. Freddi Che , who verbally acknowledged these results.   Electronically Signed   By: Lowella Grip M.D.   On: 04/23/2014 19:08   Dg Knee Right Port  04/23/2014   CLINICAL DATA:  Anterior and posterior knee since falling off of a 6 ft ladder today  EXAM: PORTABLE RIGHT KNEE - 1-2 VIEW  COMPARISON:  None  FINDINGS: Osseous  demineralization.  Osteoarthritic changes greatest at lateral compartment where the greatest degree of joint space narrowing and spur formation are identified.  Degenerative changes also present at the proximal tibiofibular joint.  No acute fracture, dislocation or bone destruction.  No knee joint effusion.  IMPRESSION: Tricompartmental osteoarthritic changes RIGHT knee greatest at lateral compartment.  Proximal tibiofibular joint degenerative changes and osseous demineralization.  No acute abnormalities.   Electronically Signed   By: Lavonia Dana M.D.   On: 04/23/2014 21:24     EKG Interpretation None      MDM   Final diagnoses:  Fall from ladder, initial encounter  Intertrochanteric fracture of right femur, closed, initial encounter   68 year old male with a history of chronic pain the presents after falling approximately 6-7 feet off a ladder and landing on his right hip. According to the patient he did not strike his head. According to the patient he has no headache or neck pain. Patient states he has severe pain in his right hip.  Exam as above demonstrates a 68 year old male no acute distress with significant right hip pain. Patient is hemodynamically stable upon arrival. The patient has normal pulse and stable blood pressures and normal O2 sats without oxygen. Patient has minimal tenderness of his lower back and severe tenderness of his right hip. Given the patient's age and the mechanism of the injury, the plan is to obtain full trauma scans. Patient stable prior to trauma scans.  Plain films demonstrate significant right-sided intertrochanteric fracture. CT scan demonstrates an aortic  arch tear. There is also intra-abdominal fluid which may be secondary to recent surgery but the patient had performed. Given the patient's intra-abdominal injuries as well as an aortic tear, plan is to consult with vascular surgery, trauma surgery, orthopedics. Each consultant was contacted by myself. Plan is  to admit to trauma surgery with plans to operate on her hip tomorrow morning. Vascular surgery does not feel that there is any acute intervention needed at this time, though recommends consultation with cardiothoracic surgery. Trauma consultant agrees to consult with cardiothoracic surgery. Patient admitted to the trauma service in stable condition. Patient seen and evaluated by myself and my attending, Dr. Jeneen Rinks.      Freddi Che, MD 04/23/14 2128

## 2014-04-23 NOTE — H&P (Signed)
Russell Mendoza is an 68 y.o. male.   Chief Complaint: s/p fall from ladder HPI: 64 yom who he says was just released from [facility] after two months for what sounds like a perforated ulcer. He doesn't know many of details about this.  He was eating, ambulating and doing well though he said.  He returned to his house and he stated that it had been vandalized.  He couldn't get in so he climbed a ladder to a window.  He then fell.  He remembers all of this.  He complains of right hip pain only.  Past Medical History  Diagnosis Date  . Broken arm   . Broken leg   . Chronic pain     Past Surgical History  Procedure Laterality Date  . Abdominal surgery      History reviewed. No pertinent family history. Social History:  reports that he has been smoking Cigarettes.  He has been smoking about 0.50 packs per day. He does not have any smokeless tobacco history on file. He reports that he does not drink alcohol or use illicit drugs.  Allergies: No Known Allergies  Meds none he states  Results for orders placed during the hospital encounter of 04/23/14 (from the past 48 hour(s))  CDS SEROLOGY     Status: None   Collection Time    04/23/14  5:43 PM      Result Value Ref Range   CDS serology specimen       Value: SPECIMEN WILL BE HELD FOR 14 DAYS IF TESTING IS REQUIRED  COMPREHENSIVE METABOLIC PANEL     Status: Abnormal   Collection Time    04/23/14  5:43 PM      Result Value Ref Range   Sodium 137  137 - 147 mEq/L   Potassium 4.1  3.7 - 5.3 mEq/L   Chloride 97  96 - 112 mEq/L   CO2 24  19 - 32 mEq/L   Glucose, Bld 140 (*) 70 - 99 mg/dL   BUN 13  6 - 23 mg/dL   Creatinine, Ser 0.87  0.50 - 1.35 mg/dL   Calcium 8.7  8.4 - 10.5 mg/dL   Total Protein 6.9  6.0 - 8.3 g/dL   Albumin 3.1 (*) 3.5 - 5.2 g/dL   AST 28  0 - 37 U/L   ALT 23  0 - 53 U/L   Alkaline Phosphatase 140 (*) 39 - 117 U/L   Total Bilirubin <0.2 (*) 0.3 - 1.2 mg/dL   GFR calc non Af Amer 87 (*) >90 mL/min   GFR calc  Af Amer >90  >90 mL/min   Comment: (NOTE)     The eGFR has been calculated using the CKD EPI equation.     This calculation has not been validated in all clinical situations.     eGFR's persistently <90 mL/min signify possible Chronic Kidney     Disease.   Anion gap 16 (*) 5 - 15  CBC     Status: Abnormal   Collection Time    04/23/14  5:43 PM      Result Value Ref Range   WBC 15.3 (*) 4.0 - 10.5 K/uL   RBC 3.40 (*) 4.22 - 5.81 MIL/uL   Hemoglobin 10.0 (*) 13.0 - 17.0 g/dL   HCT 31.8 (*) 39.0 - 52.0 %   MCV 93.5  78.0 - 100.0 fL   MCH 29.4  26.0 - 34.0 pg   MCHC 31.4  30.0 - 36.0 g/dL  RDW 14.8  11.5 - 15.5 %   Platelets 361  150 - 400 K/uL  ETHANOL     Status: None   Collection Time    04/23/14  5:43 PM      Result Value Ref Range   Alcohol, Ethyl (B) <11  0 - 11 mg/dL   Comment:            LOWEST DETECTABLE LIMIT FOR     SERUM ALCOHOL IS 11 mg/dL     FOR MEDICAL PURPOSES ONLY  PROTIME-INR     Status: None   Collection Time    04/23/14  5:43 PM      Result Value Ref Range   Prothrombin Time 14.2  11.6 - 15.2 seconds   INR 1.10  0.00 - 1.49  TYPE AND SCREEN     Status: None   Collection Time    04/23/14  5:43 PM      Result Value Ref Range   ABO/RH(D) O POS     Antibody Screen NEG     Sample Expiration 04/26/2014    ABO/RH     Status: None   Collection Time    04/23/14  5:43 PM      Result Value Ref Range   ABO/RH(D) O POS    I-STAT CHEM 8, ED     Status: Abnormal   Collection Time    04/23/14  5:56 PM      Result Value Ref Range   Sodium 136 (*) 137 - 147 mEq/L   Potassium 4.0  3.7 - 5.3 mEq/L   Chloride 99  96 - 112 mEq/L   BUN 13  6 - 23 mg/dL   Creatinine, Ser 0.90  0.50 - 1.35 mg/dL   Glucose, Bld 141 (*) 70 - 99 mg/dL   Calcium, Ion 1.20  1.13 - 1.30 mmol/L   TCO2 26  0 - 100 mmol/L   Hemoglobin 11.9 (*) 13.0 - 17.0 g/dL   HCT 35.0 (*) 39.0 - 52.0 %   Dg Chest 1 View  04/23/2014   CLINICAL DATA:  RIGHT hip fracture post fall  EXAM: CHEST - 1 VIEW   COMPARISON:  03/05/2014  FINDINGS: Normal heart size, mediastinal contours, and pulmonary vascularity.  Skin folds project over RIGHT chest.  Lungs appear minimally hyperinflated but clear.  No pleural effusion or pneumothorax.  Bones demineralized.  IMPRESSION: No acute abnormalities.   Electronically Signed   By: Lavonia Dana M.D.   On: 04/23/2014 19:33   Dg Hip Complete Right  04/23/2014   CLINICAL DATA:  RIGHT hip fracture post fall  EXAM: RIGHT HIP - COMPLETE 2+ VIEW  COMPARISON:  None  FINDINGS: Excreted contrast material within urinary bladder from preceding CT, with indentation of the bladder base suggesting enlarged prostate gland.  Comminuted displaced intertrochanteric fracture RIGHT femur with varus angulation.  Diffuse osseous demineralization.  Symmetric hip and SI joints.  Degenerative disc disease changes lower lumbar spine.  No additional fracture, dislocation or bone destruction.  IMPRESSION: Comminuted displaced and angulated intertrochanteric fracture RIGHT femur.   Electronically Signed   By: Lavonia Dana M.D.   On: 04/23/2014 19:31   Ct Head Wo Contrast  04/23/2014   CLINICAL DATA:  Fall from ladder.  EXAM: CT HEAD WITHOUT CONTRAST  CT CERVICAL SPINE WITHOUT CONTRAST  TECHNIQUE: Multidetector CT imaging of the head and cervical spine was performed following the standard protocol without intravenous contrast. Multiplanar CT image reconstructions of the cervical spine were also  generated.  COMPARISON:  PET-CT from 03/05/2014.  FINDINGS: CT HEAD FINDINGS  There is no evidence for acute hemorrhage, hydrocephalus, mass lesion, or abnormal extra-axial fluid collection. No definite CT evidence for acute infarction. Diffuse loss of parenchymal volume is consistent with atrophy. Patchy low attenuation in the deep hemispheric and periventricular white matter is nonspecific, but likely reflects chronic microvascular ischemic demyelination.The visualized paranasal sinuses and mastoid air cells are  clear. No evidence for skull fracture.  CT CERVICAL SPINE FINDINGS  Imaging was obtained from the skullbase through the T1 to interspace. There is no fracture. Loss of disc height is seen at C5-6 and C6-7. Trace anterolisthesis of C7 on T1 is compatible with the degree of facet degeneration at that level. No prevertebral soft tissue swelling. Cervical lordosis is preserved.  IMPRESSION: No acute intracranial abnormality.  Atrophy with chronic small vessel white matter ischemic demyelination.  No evidence for cervical spine fracture with multilevel degenerative changes.   Electronically Signed   By: Misty Stanley M.D.   On: 04/23/2014 18:52   Ct Chest W Contrast  04/23/2014   CLINICAL DATA:  Pain post trauma  EXAM: CT CHEST, ABDOMEN, AND PELVIS WITH CONTRAST  TECHNIQUE: Multidetector CT imaging of the chest, abdomen and pelvis was performed following the standard protocol during bolus administration of intravenous contrast.  CONTRAST:  11m OMNIPAQUE IOHEXOL 300 MG/ML  SOLN  COMPARISON:  CT abdomen and pelvis Mar 05, 2014  FINDINGS: CT CHEST FINDINGS  There is underlying emphysematous change. There is no edema or consolidation. No pneumothorax. No parenchymal lung contusion is appreciable.  There is no demonstrable mediastinal hematoma. There is atherosclerotic change in the aorta. There is a short segment tear in the aortic arch which is near but not directly involving the right and left common carotid arteries. There is mild atherosclerotic change at the origins of these vessels. There is somewhat greater atherosclerotic calcification at the origin of the left subclavian artery. No great vessel dissection is seen. There is no thoracic aortic aneurysm. There is no major vessel pulmonary embolus.  Thyroid appears normal. No fractures are noted in the thoracic region.  CT ABDOMEN AND PELVIS FINDINGS  There is a small amount of perihepatic fluid. No liver laceration or rupture is demonstrated on this study. The  liver has a mildly inhomogeneous appearance, most likely due to hepatic steatosis. There may be a degree of underlying parenchymal disease as well in the liver. No liver masses are appreciable. There is no biliary duct dilatation. Gallbladder wall is not thickened.  Spleen appears intact without laceration or rupture. No splenic lesions are identified.  Pancreas and adrenals appear normal. There is a cyst arising from the upper pole of the right kidney measuring 3.2 x 2.8 cm. There is a cyst arising mid left kidney laterally measuring 2.6 by 1.9 cm. There is a cyst in the posterior mid left kidney measuring 1.0 by 0.8 cm. There is a tiny cyst arising from the anterior lower pole right kidney. There is no hydronephrosis or noncystic renal mass on either side. There is no evidence of renal contusion or perinephric fluid. No renal laceration or rupture. No contrast extravasation.  There is dilatation of the distal abdominal aorta with a maximum transverse diameter of 3.7 x 3.5 cm. There is peripheral thrombus in the aorta. There is no evidence of periaortic fluid or aortic disruption.  In the pelvis, the urinary bladder is midline with normal wall thickness. Prostate is prominent. There is no pelvic mass  or fluid collection. Appendix appears normal.  There is fluid throughout most of the small bowel. There is no small bowel obstruction. No free air or portal venous air apparent.  There is thickening of bowel wall in multiple loops of jejunum. There is no surrounding mesenteric thickening, however. There is no adenopathy or abscess in the an or pelvis. The previously noted ascites in the pelvis has resolved.  There is a comminuted intertrochanteric femur fracture on the right with varus angulation at the fracture site. There is impaction at the fracture site as well. There is soft tissue hemorrhage surrounding this comminuted fracture of the right proximal femur region. There is degenerative change in the lumbar spine.  No other fractures are appreciable.  IMPRESSION: CT chest: There is a small tear in the aorta at the level of the arch which is not involving the great vessels. 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 There is underlying emphysema. No pneumothorax or lung contusion. No consolidation. No evidence of mediastinal hematoma. No fractures are identified in the thoracic region.  CT abdomen and pelvis: There is a comminuted intertrochanteric femur fracture on the right with varus angulation at the fracture site.  There is perihepatic fluid without other ascites. The patient had recent ascites following apparent bowel perforation on prior study. While it is possible that the fluid in the perihepatic region is secondary to the residual ascites seen at the time of the bowel obstruction, the localized nature of the current perihepatic fluid raises concern for potential liver injury. No other liver lesion is appreciable. There is apparent hepatic steatosis. There may be underlying parenchymal liver disease is well.  Several loops of jejunum show wall thickening. This thickening could represent residua from previous bowel obstruction. The possibility of bowel injury must be of concern. This patient should be followed closely clinically given this possibility.  There is a small distal abdominal aortic aneurysm.  There are renal cysts bilaterally. No noncystic renal masses are identified.  These results were called by telephone at the time of interpretation on 04/23/2014 at 7:08 pm to Dr. Freddi Che , who verbally acknowledged these results.   Electronically Signed   By: Lowella Grip M.D.   On: 04/23/2014 19:08   Ct Cervical Spine Wo Contrast  04/23/2014   CLINICAL DATA:  Fall from ladder.  EXAM: CT HEAD WITHOUT CONTRAST  CT CERVICAL SPINE WITHOUT CONTRAST  TECHNIQUE: Multidetector CT  imaging of the head and cervical spine was performed following the standard protocol without intravenous contrast. Multiplanar CT image reconstructions of the cervical spine were also generated.  COMPARISON:  PET-CT from 03/05/2014.  FINDINGS: CT HEAD FINDINGS  There is no evidence for acute hemorrhage, hydrocephalus, mass lesion, or abnormal extra-axial fluid collection. No definite CT evidence for acute infarction. Diffuse loss of parenchymal volume is consistent with atrophy. Patchy low attenuation in the deep hemispheric and periventricular white matter is nonspecific, but likely reflects chronic microvascular ischemic demyelination.The visualized paranasal sinuses and mastoid air cells are clear. No evidence for skull fracture.  CT CERVICAL SPINE FINDINGS  Imaging was obtained from the skullbase through the T1 to interspace. There is no fracture. Loss of disc height is seen at C5-6 and C6-7. Trace anterolisthesis of C7 on T1 is compatible with the degree of facet degeneration at that level. No prevertebral soft tissue swelling. Cervical lordosis is preserved.  IMPRESSION: No acute intracranial abnormality.  Atrophy with chronic small vessel white matter ischemic demyelination.  No evidence for cervical spine fracture with multilevel degenerative changes.   Electronically Signed   By: Misty Stanley M.D.   On: 04/23/2014 18:52   Ct Abdomen Pelvis W Contrast  04/23/2014   CLINICAL DATA:  Pain post trauma  EXAM: CT CHEST, ABDOMEN, AND PELVIS WITH CONTRAST  TECHNIQUE: Multidetector CT imaging of the chest, abdomen and pelvis was performed following the standard protocol during bolus administration of intravenous contrast.  CONTRAST:  52m OMNIPAQUE IOHEXOL 300 MG/ML  SOLN  COMPARISON:  CT abdomen and pelvis Mar 05, 2014  FINDINGS: CT CHEST FINDINGS  There is underlying emphysematous change. There is no edema or consolidation. No pneumothorax. No parenchymal lung contusion is appreciable.  There is no demonstrable  mediastinal hematoma. There is atherosclerotic change in the aorta. There is a short segment tear in the aortic arch which is near but not directly involving the right and left common carotid arteries. There is mild atherosclerotic change at the origins of these vessels. There is somewhat greater atherosclerotic calcification at the origin of the left subclavian artery. No great vessel dissection is seen. There is no thoracic aortic aneurysm. There is no major vessel pulmonary embolus.  Thyroid appears normal. No fractures are noted in the thoracic region.  CT ABDOMEN AND PELVIS FINDINGS  There is a small amount of perihepatic fluid. No liver laceration or rupture is demonstrated on this study. The liver has a mildly inhomogeneous appearance, most likely due to hepatic steatosis. There may be a degree of underlying parenchymal disease as well in the liver. No liver masses are appreciable. There is no biliary duct dilatation. Gallbladder wall is not thickened.  Spleen appears intact without laceration or rupture. No splenic lesions are identified.  Pancreas and adrenals appear normal. There is a cyst arising from the upper pole of the right kidney measuring 3.2 x 2.8 cm. There is a cyst arising mid left kidney laterally measuring 2.6 by 1.9 cm. There is a cyst in the posterior mid left kidney measuring 1.0 by 0.8 cm. There is a tiny cyst arising from the anterior lower pole right kidney. There is no hydronephrosis or noncystic renal mass on either side. There is no evidence of renal contusion or perinephric fluid. No renal laceration or rupture. No contrast extravasation.  There is dilatation of the distal abdominal aorta with a maximum transverse diameter of 3.7 x 3.5 cm. There is peripheral thrombus in the aorta. There is no evidence of periaortic fluid or aortic disruption.  In the pelvis, the urinary bladder is midline with normal wall thickness. Prostate is prominent. There is no pelvic mass or fluid collection.  Appendix appears normal.  There is fluid throughout most of the small bowel. There is no small bowel obstruction. No free air or portal venous air apparent.  There is thickening of bowel wall in multiple loops of jejunum. There is no surrounding mesenteric thickening, however. There is no adenopathy or abscess in the an or pelvis. The previously noted ascites in the pelvis has resolved.  There is a comminuted intertrochanteric femur fracture on the right with varus angulation at the fracture site. There is impaction at the fracture site as well. There is soft tissue hemorrhage surrounding this comminuted fracture of the right proximal femur region. There is degenerative change in the lumbar spine. No other fractures are appreciable.  IMPRESSION: CT chest: There is a small tear in the  aorta at the level of the arch which is not involving the great vessels. 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 There is underlying emphysema. No pneumothorax or lung contusion. No consolidation. No evidence of mediastinal hematoma. No fractures are identified in the thoracic region.  CT abdomen and pelvis: There is a comminuted intertrochanteric femur fracture on the right with varus angulation at the fracture site.  There is perihepatic fluid without other ascites. The patient had recent ascites following apparent bowel perforation on prior study. While it is possible that the fluid in the perihepatic region is secondary to the residual ascites seen at the time of the bowel obstruction, the localized nature of the current perihepatic fluid raises concern for potential liver injury. No other liver lesion is appreciable. There is apparent hepatic steatosis. There may be underlying parenchymal liver disease is well.  Several loops of jejunum show wall thickening. This thickening could represent residua  from previous bowel obstruction. The possibility of bowel injury must be of concern. This patient should be followed closely clinically given this possibility.  There is a small distal abdominal aortic aneurysm.  There are renal cysts bilaterally. No noncystic renal masses are identified.  These results were called by telephone at the time of interpretation on 04/23/2014 at 7:08 pm to Dr. Freddi Che , who verbally acknowledged these results.   Electronically Signed   By: Lowella Grip M.D.   On: 04/23/2014 19:08    Review of Systems  Constitutional: Negative for fever and chills.  Eyes: Negative for blurred vision and double vision.  Respiratory: Negative for shortness of breath.   Cardiovascular: Negative for chest pain.  Gastrointestinal: Negative for abdominal pain.  Genitourinary: Negative for urgency and frequency.  Musculoskeletal: Negative for back pain and neck pain.  Neurological: Negative for speech change, focal weakness, seizures, loss of consciousness and headaches.  Psychiatric/Behavioral: The patient is nervous/anxious.     Blood pressure 125/73, pulse 75, temperature 98.5 F (36.9 C), resp. rate 15, height 5' 5"  (1.651 m), weight 140 lb (63.504 kg), SpO2 100.00%. Physical Exam  Vitals reviewed. Constitutional: He appears cachectic.  HENT:  Head: Normocephalic and atraumatic.  Right Ear: External ear normal.  Left Ear: External ear normal.  Mouth/Throat: Oropharynx is clear and moist.  Eyes: EOM are normal. Pupils are equal, round, and reactive to light. No scleral icterus.  Neck: Neck supple.  Cardiovascular: Normal rate, regular rhythm, normal heart sounds and intact distal pulses.   Respiratory: Effort normal and breath sounds normal. He has no wheezes. He has no rales. He exhibits no tenderness.  GI: Soft. Bowel sounds are normal. There is no tenderness.    Musculoskeletal:       Right hip: He exhibits tenderness and bony tenderness.  Lymphadenopathy:    He  has no cervical adenopathy.  Neurological: He is alert.     Assessment/Plan S/p fall  Aortic tear likely artifact on ct, have discussed with ct surgery Right hip fracture, ortho has been consulted if does well overnight can get fixed tomorrow Hold lovenox with fracture and ecchymosis   Nicloe Frontera 04/23/2014, 9:06 PM

## 2014-04-24 ENCOUNTER — Encounter (HOSPITAL_COMMUNITY): Payer: Medicare Other | Admitting: Certified Registered Nurse Anesthetist

## 2014-04-24 ENCOUNTER — Encounter (HOSPITAL_COMMUNITY): Admission: EM | Disposition: A | Discharge status: Skilled Nursing Facility | Payer: Self-pay | Source: Home / Self Care

## 2014-04-24 ENCOUNTER — Inpatient Hospital Stay (HOSPITAL_COMMUNITY): Payer: Medicare Other

## 2014-04-24 ENCOUNTER — Inpatient Hospital Stay (HOSPITAL_COMMUNITY): Payer: Medicare Other | Admitting: Certified Registered Nurse Anesthetist

## 2014-04-24 ENCOUNTER — Encounter (HOSPITAL_COMMUNITY): Payer: Self-pay | Admitting: Certified Registered Nurse Anesthetist

## 2014-04-24 DIAGNOSIS — T50905A Adverse effect of unspecified drugs, medicaments and biological substances, initial encounter: Secondary | ICD-10-CM

## 2014-04-24 DIAGNOSIS — F419 Anxiety disorder, unspecified: Secondary | ICD-10-CM | POA: Insufficient documentation

## 2014-04-24 DIAGNOSIS — M1990 Unspecified osteoarthritis, unspecified site: Secondary | ICD-10-CM | POA: Insufficient documentation

## 2014-04-24 DIAGNOSIS — S72001A Fracture of unspecified part of neck of right femur, initial encounter for closed fracture: Secondary | ICD-10-CM

## 2014-04-24 DIAGNOSIS — D62 Acute posthemorrhagic anemia: Secondary | ICD-10-CM

## 2014-04-24 DIAGNOSIS — Z789 Other specified health status: Secondary | ICD-10-CM

## 2014-04-24 HISTORY — PX: INTRAMEDULLARY (IM) NAIL INTERTROCHANTERIC: SHX5875

## 2014-04-24 HISTORY — DX: Fracture of unspecified part of neck of right femur, initial encounter for closed fracture: S72.001A

## 2014-04-24 LAB — CBC
HCT: 28.1 % — ABNORMAL LOW (ref 39.0–52.0)
HCT: 26.6 % — ABNORMAL LOW (ref 39.0–52.0)
Hemoglobin: 8.9 g/dL — ABNORMAL LOW (ref 13.0–17.0)
Hemoglobin: 8.2 g/dL — ABNORMAL LOW (ref 13.0–17.0)
MCH: 29.4 pg (ref 26.0–34.0)
MCH: 28.7 pg (ref 26.0–34.0)
MCHC: 31.7 g/dL (ref 30.0–36.0)
MCHC: 30.8 g/dL (ref 30.0–36.0)
MCV: 92.7 fL (ref 78.0–100.0)
MCV: 93 fL (ref 78.0–100.0)
PLATELETS: 261 10*3/uL (ref 150–400)
Platelets: 297 10*3/uL (ref 150–400)
RBC: 3.03 MIL/uL — AB (ref 4.22–5.81)
RBC: 2.86 MIL/uL — AB (ref 4.22–5.81)
RDW: 14.8 % (ref 11.5–15.5)
RDW: 14.7 % (ref 11.5–15.5)
WBC: 8.9 10*3/uL (ref 4.0–10.5)
WBC: 13.7 10*3/uL — AB (ref 4.0–10.5)

## 2014-04-24 LAB — BASIC METABOLIC PANEL
ANION GAP: 13 (ref 5–15)
BUN: 9 mg/dL (ref 6–23)
CO2: 24 meq/L (ref 19–32)
Calcium: 8.3 mg/dL — ABNORMAL LOW (ref 8.4–10.5)
Chloride: 101 mEq/L (ref 96–112)
Creatinine, Ser: 0.64 mg/dL (ref 0.50–1.35)
GFR calc non Af Amer: >90 mL/min (ref >90)
Glucose, Bld: 116 mg/dL — ABNORMAL HIGH (ref 70–99)
POTASSIUM: 3.8 meq/L (ref 3.7–5.3)
Sodium: 138 mEq/L (ref 137–147)

## 2014-04-24 LAB — CREATININE, SERUM: Creatinine, Ser: 0.56 mg/dL (ref 0.50–1.35)

## 2014-04-24 LAB — VITAMIN D 25 HYDROXY (VIT D DEFICIENCY, FRACTURES): Vit D, 25-Hydroxy: 21 ng/mL — ABNORMAL LOW (ref 30–89)

## 2014-04-24 SURGERY — FIXATION, FRACTURE, INTERTROCHANTERIC, WITH INTRAMEDULLARY ROD
Anesthesia: General | Site: Hip | Laterality: Right

## 2014-04-24 MED ORDER — ONDANSETRON HCL 4 MG PO TABS: 4.0000 mg | ORAL_TABLET | Freq: Four times a day (QID) | ORAL | Status: DC | PRN

## 2014-04-24 MED ORDER — BISACODYL 5 MG PO TBEC
5.0000 mg | DELAYED_RELEASE_TABLET | Freq: Every day | ORAL | Status: DC | PRN
  Filled 2014-04-24: qty 1

## 2014-04-24 MED ORDER — ALBUMIN HUMAN 5 % IV SOLN
INTRAVENOUS | Status: DC | PRN
  Administered 2014-04-24: 10:00:00 via INTRAVENOUS

## 2014-04-24 MED ORDER — GLYCOPYRROLATE 0.2 MG/ML IJ SOLN
INTRAMUSCULAR | Status: AC
  Filled 2014-04-24: qty 5

## 2014-04-24 MED ORDER — METHOCARBAMOL 500 MG PO TABS
500.0000 mg | ORAL_TABLET | Freq: Four times a day (QID) | ORAL | Status: DC | PRN
  Administered 2014-04-25 – 2014-04-28 (×10): 500 mg via ORAL
  Filled 2014-04-24 (×11): qty 1

## 2014-04-24 MED ORDER — CEFAZOLIN SODIUM-DEXTROSE 2-3 GM-% IV SOLR
2.0000 g | Freq: Four times a day (QID) | INTRAVENOUS | Status: AC
  Administered 2014-04-24 (×2): 2 g via INTRAVENOUS
  Filled 2014-04-24 (×2): qty 50

## 2014-04-24 MED ORDER — ONDANSETRON HCL 4 MG/2ML IJ SOLN
INTRAMUSCULAR | Status: AC
  Filled 2014-04-24: qty 2

## 2014-04-24 MED ORDER — PROPOFOL 10 MG/ML IV BOLUS
INTRAVENOUS | Status: DC | PRN
  Administered 2014-04-24: 100 mg via INTRAVENOUS

## 2014-04-24 MED ORDER — OXYCODONE HCL 5 MG PO TABS
5.0000 mg | ORAL_TABLET | Freq: Once | ORAL | Status: AC | PRN
  Administered 2014-04-24: 5 mg via ORAL

## 2014-04-24 MED ORDER — PROPOFOL 10 MG/ML IV BOLUS
INTRAVENOUS | Status: AC
  Filled 2014-04-24: qty 20

## 2014-04-24 MED ORDER — SERTRALINE HCL 25 MG PO TABS
25.0000 mg | ORAL_TABLET | Freq: Every day | ORAL | Status: DC
  Administered 2014-04-24 – 2014-04-27 (×4): 25 mg via ORAL
  Filled 2014-04-24 (×5): qty 1

## 2014-04-24 MED ORDER — OXYCODONE HCL 5 MG PO TABS
10.0000 mg | ORAL_TABLET | ORAL | Status: DC | PRN
  Administered 2014-04-24: 10 mg via ORAL
  Administered 2014-04-24 – 2014-04-28 (×17): 20 mg via ORAL
  Filled 2014-04-24 (×10): qty 4
  Filled 2014-04-24: qty 2
  Filled 2014-04-24 (×8): qty 4

## 2014-04-24 MED ORDER — GLYCOPYRROLATE 0.2 MG/ML IJ SOLN
INTRAMUSCULAR | Status: AC
  Filled 2014-04-24: qty 2

## 2014-04-24 MED ORDER — FENTANYL CITRATE 0.05 MG/ML IJ SOLN
INTRAMUSCULAR | Status: AC
  Filled 2014-04-24: qty 5

## 2014-04-24 MED ORDER — METOCLOPRAMIDE HCL 5 MG PO TABS
5.0000 mg | ORAL_TABLET | Freq: Three times a day (TID) | ORAL | Status: DC | PRN
  Filled 2014-04-24: qty 2

## 2014-04-24 MED ORDER — ONDANSETRON HCL 4 MG/2ML IJ SOLN: 4.0000 mg | Freq: Four times a day (QID) | INTRAMUSCULAR | Status: DC | PRN

## 2014-04-24 MED ORDER — ENOXAPARIN SODIUM 40 MG/0.4ML ~~LOC~~ SOLN
40.0000 mg | SUBCUTANEOUS | Status: DC
  Administered 2014-04-25 – 2014-04-28 (×4): 40 mg via SUBCUTANEOUS
  Filled 2014-04-24 (×5): qty 0.4

## 2014-04-24 MED ORDER — OXYCODONE HCL 5 MG PO TABS
ORAL_TABLET | ORAL | Status: AC
  Filled 2014-04-24: qty 1

## 2014-04-24 MED ORDER — NEOSTIGMINE METHYLSULFATE 10 MG/10ML IV SOLN
INTRAVENOUS | Status: DC | PRN
  Administered 2014-04-24: 4 mg via INTRAVENOUS

## 2014-04-24 MED ORDER — HYDROMORPHONE HCL PF 1 MG/ML IJ SOLN
INTRAMUSCULAR | Status: AC
  Filled 2014-04-24: qty 1

## 2014-04-24 MED ORDER — MIDAZOLAM HCL 2 MG/2ML IJ SOLN
INTRAMUSCULAR | Status: AC
  Filled 2014-04-24: qty 2

## 2014-04-24 MED ORDER — LIDOCAINE HCL (CARDIAC) 20 MG/ML IV SOLN
INTRAVENOUS | Status: DC | PRN
  Administered 2014-04-24: 80 mg via INTRAVENOUS

## 2014-04-24 MED ORDER — PHENYLEPHRINE HCL 10 MG/ML IJ SOLN
INTRAMUSCULAR | Status: DC | PRN
  Administered 2014-04-24 (×5): 80 ug via INTRAVENOUS
  Administered 2014-04-24: 40 ug via INTRAVENOUS
  Administered 2014-04-24 (×2): 80 ug via INTRAVENOUS

## 2014-04-24 MED ORDER — PHENYLEPHRINE HCL 10 MG/ML IJ SOLN
10.0000 mg | INTRAMUSCULAR | Status: DC | PRN
  Administered 2014-04-24: 40 ug/min via INTRAVENOUS

## 2014-04-24 MED ORDER — LISINOPRIL 5 MG PO TABS
5.0000 mg | ORAL_TABLET | Freq: Every day | ORAL | Status: DC
  Administered 2014-04-25 – 2014-04-28 (×4): 5 mg via ORAL
  Filled 2014-04-24 (×5): qty 1

## 2014-04-24 MED ORDER — FENTANYL CITRATE 0.05 MG/ML IJ SOLN
INTRAMUSCULAR | Status: DC | PRN
  Administered 2014-04-24: 50 ug via INTRAVENOUS
  Administered 2014-04-24: 100 ug via INTRAVENOUS
  Administered 2014-04-24 (×2): 50 ug via INTRAVENOUS

## 2014-04-24 MED ORDER — ROCURONIUM BROMIDE 100 MG/10ML IV SOLN
INTRAVENOUS | Status: DC | PRN
  Administered 2014-04-24: 40 mg via INTRAVENOUS
  Administered 2014-04-24: 10 mg via INTRAVENOUS

## 2014-04-24 MED ORDER — DOCUSATE SODIUM 100 MG PO CAPS
100.0000 mg | ORAL_CAPSULE | Freq: Two times a day (BID) | ORAL | Status: DC
  Administered 2014-04-24 – 2014-04-28 (×9): 100 mg via ORAL
  Filled 2014-04-24 (×12): qty 1

## 2014-04-24 MED ORDER — NEOSTIGMINE METHYLSULFATE 10 MG/10ML IV SOLN
INTRAVENOUS | Status: AC
  Filled 2014-04-24: qty 1

## 2014-04-24 MED ORDER — EPHEDRINE SULFATE 50 MG/ML IJ SOLN
INTRAMUSCULAR | Status: DC | PRN
  Administered 2014-04-24 (×3): 5 mg via INTRAVENOUS
  Administered 2014-04-24: 10 mg via INTRAVENOUS
  Administered 2014-04-24 (×2): 5 mg via INTRAVENOUS
  Administered 2014-04-24: 10 mg via INTRAVENOUS

## 2014-04-24 MED ORDER — STERILE WATER FOR INJECTION IJ SOLN
INTRAMUSCULAR | Status: AC
  Filled 2014-04-24: qty 20

## 2014-04-24 MED ORDER — MORPHINE SULFATE 2 MG/ML IJ SOLN
2.0000 mg | INTRAMUSCULAR | Status: DC | PRN
  Administered 2014-04-24 – 2014-04-28 (×5): 2 mg via INTRAVENOUS
  Filled 2014-04-24 (×5): qty 1

## 2014-04-24 MED ORDER — METOCLOPRAMIDE HCL 5 MG/ML IJ SOLN
5.0000 mg | Freq: Three times a day (TID) | INTRAMUSCULAR | Status: DC | PRN
  Filled 2014-04-24: qty 2

## 2014-04-24 MED ORDER — PHENOL 1.4 % MT LIQD
1.0000 | OROMUCOSAL | Status: DC | PRN
Start: 2014-04-24 — End: 2014-04-28

## 2014-04-24 MED ORDER — ROCURONIUM BROMIDE 50 MG/5ML IV SOLN
INTRAVENOUS | Status: AC
  Filled 2014-04-24: qty 1

## 2014-04-24 MED ORDER — MENTHOL 3 MG MT LOZG
1.0000 | LOZENGE | OROMUCOSAL | Status: DC | PRN
Start: 2014-04-24 — End: 2014-04-28

## 2014-04-24 MED ORDER — ALPRAZOLAM 0.5 MG PO TABS
1.0000 mg | ORAL_TABLET | Freq: Three times a day (TID) | ORAL | Status: DC
  Administered 2014-04-24 – 2014-04-28 (×12): 1 mg via ORAL
  Filled 2014-04-24 (×12): qty 2

## 2014-04-24 MED ORDER — OXYCODONE HCL ER 20 MG PO T12A
20.0000 mg | EXTENDED_RELEASE_TABLET | Freq: Two times a day (BID) | ORAL | Status: DC
  Administered 2014-04-24 – 2014-04-25 (×2): 20 mg via ORAL
  Filled 2014-04-24 (×2): qty 2

## 2014-04-24 MED ORDER — LIDOCAINE HCL (CARDIAC) 20 MG/ML IV SOLN
INTRAVENOUS | Status: AC
  Filled 2014-04-24: qty 5

## 2014-04-24 MED ORDER — 0.9 % SODIUM CHLORIDE (POUR BTL) OPTIME
TOPICAL | Status: DC | PRN
  Administered 2014-04-24: 1000 mL

## 2014-04-24 MED ORDER — HYDROMORPHONE HCL PF 1 MG/ML IJ SOLN
0.2500 mg | INTRAMUSCULAR | Status: DC | PRN
  Administered 2014-04-24 (×4): 0.5 mg via INTRAVENOUS

## 2014-04-24 MED ORDER — PROPOFOL 10 MG/ML IV BOLUS
INTRAVENOUS | Status: AC
Start: 2014-04-24 — End: 2014-04-24
  Filled 2014-04-24: qty 20

## 2014-04-24 MED ORDER — LACTATED RINGERS IV SOLN
INTRAVENOUS | Status: DC | PRN
  Administered 2014-04-24 (×2): via INTRAVENOUS

## 2014-04-24 MED ORDER — OXYCODONE HCL 5 MG PO TABS
5.0000 mg | ORAL_TABLET | Freq: Four times a day (QID) | ORAL | Status: DC | PRN
  Administered 2014-04-24: 10 mg via ORAL
  Filled 2014-04-24: qty 2

## 2014-04-24 MED ORDER — ACETAMINOPHEN 500 MG PO TABS
1000.0000 mg | ORAL_TABLET | Freq: Four times a day (QID) | ORAL | Status: AC
  Administered 2014-04-24 – 2014-04-25 (×4): 1000 mg via ORAL
  Filled 2014-04-24 (×4): qty 2

## 2014-04-24 MED ORDER — GLYCOPYRROLATE 0.2 MG/ML IJ SOLN
INTRAMUSCULAR | Status: DC | PRN
  Administered 2014-04-24: .7 mg via INTRAVENOUS

## 2014-04-24 MED ORDER — MIDAZOLAM HCL 5 MG/5ML IJ SOLN
INTRAMUSCULAR | Status: DC | PRN
  Administered 2014-04-24: 2 mg via INTRAVENOUS

## 2014-04-24 MED ORDER — EPHEDRINE SULFATE 50 MG/ML IJ SOLN
INTRAMUSCULAR | Status: AC
  Filled 2014-04-24: qty 2

## 2014-04-24 MED ORDER — ARTIFICIAL TEARS OP OINT
TOPICAL_OINTMENT | OPHTHALMIC | Status: DC | PRN
  Administered 2014-04-24: 1 via OPHTHALMIC

## 2014-04-24 MED ORDER — PROMETHAZINE HCL 25 MG/ML IJ SOLN: 6.2500 mg | INTRAMUSCULAR | Status: DC | PRN

## 2014-04-24 MED ORDER — OXYCODONE HCL 5 MG/5ML PO SOLN: 5.0000 mg | Freq: Once | ORAL | Status: AC | PRN

## 2014-04-24 MED ORDER — ONDANSETRON HCL 4 MG/2ML IJ SOLN
INTRAMUSCULAR | Status: DC | PRN
  Administered 2014-04-24: 4 mg via INTRAVENOUS

## 2014-04-24 MED ORDER — POLYETHYLENE GLYCOL 3350 17 G PO PACK
17.0000 g | PACK | Freq: Every day | ORAL | Status: DC | PRN
  Filled 2014-04-24: qty 1

## 2014-04-24 SURGICAL SUPPLY — 53 items
BIT DRILL 4.3MMS DISTAL GRDTED (BIT) IMPLANT
BNDG COHESIVE 6X5 TAN STRL LF (GAUZE/BANDAGES/DRESSINGS) IMPLANT
BRUSH SCRUB DISP (MISCELLANEOUS) ×6 IMPLANT
CANISTER SUCT 3000ML (MISCELLANEOUS) ×2 IMPLANT
CORTICAL BONE SCR 5.0MM X 46MM (Screw) ×3 IMPLANT
COVER PERINEAL POST (MISCELLANEOUS) ×3 IMPLANT
COVER SURGICAL LIGHT HANDLE (MISCELLANEOUS) ×6 IMPLANT
DRAPE C-ARMOR (DRAPES) ×3 IMPLANT
DRAPE ORTHO SPLIT 77X108 STRL (DRAPES)
DRAPE PROXIMA HALF (DRAPES) IMPLANT
DRAPE STERI IOBAN 125X83 (DRAPES) ×3 IMPLANT
DRAPE SURG ORHT 6 SPLT 77X108 (DRAPES) IMPLANT
DRAPE U-SHAPE 47X51 STRL (DRAPES) ×3 IMPLANT
DRILL 4.3MMS DISTAL GRADUATED (BIT) ×3
DRSG EMULSION OIL 3X3 NADH (GAUZE/BANDAGES/DRESSINGS) ×1 IMPLANT
DRSG MEPILEX BORDER 4X4 (GAUZE/BANDAGES/DRESSINGS) ×7 IMPLANT
DRSG MEPILEX BORDER 4X8 (GAUZE/BANDAGES/DRESSINGS) ×1 IMPLANT
ELECT REM PT RETURN 9FT ADLT (ELECTROSURGICAL) ×3
ELECTRODE REM PT RTRN 9FT ADLT (ELECTROSURGICAL) ×1 IMPLANT
GLOVE BIO SURGEON STRL SZ7.5 (GLOVE) ×3 IMPLANT
GLOVE BIO SURGEON STRL SZ8 (GLOVE) ×3 IMPLANT
GLOVE BIOGEL PI IND STRL 7.5 (GLOVE) ×1 IMPLANT
GLOVE BIOGEL PI IND STRL 8 (GLOVE) ×1 IMPLANT
GLOVE BIOGEL PI INDICATOR 7.5 (GLOVE) ×2
GLOVE BIOGEL PI INDICATOR 8 (GLOVE) ×2
GOWN STRL REUS W/ TWL LRG LVL3 (GOWN DISPOSABLE) ×2 IMPLANT
GOWN STRL REUS W/ TWL XL LVL3 (GOWN DISPOSABLE) ×1 IMPLANT
GOWN STRL REUS W/TWL LRG LVL3 (GOWN DISPOSABLE) ×6
GOWN STRL REUS W/TWL XL LVL3 (GOWN DISPOSABLE) ×3
HFN RH 130 DEG 11MM X 360MM (Orthopedic Implant) ×2 IMPLANT
HIP FRAC NAIL LAG SCR 10.5X100 (Orthopedic Implant) ×2 IMPLANT
KIT BASIN OR (CUSTOM PROCEDURE TRAY) ×3 IMPLANT
KIT ROOM TURNOVER OR (KITS) ×3 IMPLANT
LINER BOOT UNIVERSAL DISP (MISCELLANEOUS) ×3 IMPLANT
MANIFOLD NEPTUNE II (INSTRUMENTS) ×1 IMPLANT
NS IRRIG 1000ML POUR BTL (IV SOLUTION) ×3 IMPLANT
PACK GENERAL/GYN (CUSTOM PROCEDURE TRAY) ×3 IMPLANT
PAD ARMBOARD 7.5X6 YLW CONV (MISCELLANEOUS) ×6 IMPLANT
PIN GUIDE 3.2 903003004 (MISCELLANEOUS) ×2 IMPLANT
SCREW CANN THRD AFF 10.5X100 (Orthopedic Implant) IMPLANT
SCREW CORTICL BON 5.0MM X 46MM (Screw) IMPLANT
STAPLER VISISTAT 35W (STAPLE) ×3 IMPLANT
STOCKINETTE IMPERVIOUS LG (DRAPES) IMPLANT
SUT ETHILON 3 0 PS 1 (SUTURE) ×3 IMPLANT
SUT VIC AB 0 CT1 27 (SUTURE) ×3
SUT VIC AB 0 CT1 27XBRD ANBCTR (SUTURE) ×1 IMPLANT
SUT VIC AB 1 CT1 27 (SUTURE) ×3
SUT VIC AB 1 CT1 27XBRD ANBCTR (SUTURE) ×1 IMPLANT
SUT VIC AB 2-0 CT1 27 (SUTURE) ×3
SUT VIC AB 2-0 CT1 TAPERPNT 27 (SUTURE) ×1 IMPLANT
TOWEL OR 17X24 6PK STRL BLUE (TOWEL DISPOSABLE) ×3 IMPLANT
TOWEL OR 17X26 10 PK STRL BLUE (TOWEL DISPOSABLE) ×6 IMPLANT
WATER STERILE IRR 1000ML POUR (IV SOLUTION) ×3 IMPLANT

## 2014-04-24 NOTE — Transfer of Care (Signed)
Immediate Anesthesia Transfer of Care Note  Patient: Russell Mendoza  Procedure(s) Performed: Procedure(s): INTRAMEDULLARY (IM) NAIL INTERTROCHANTRIC (Right)  Patient Location: PACU  Anesthesia Type:General  Level of Consciousness: awake, alert  and oriented  Airway & Oxygen Therapy: Patient Spontanous Breathing and Patient connected to nasal cannula oxygen  Post-op Assessment: Report given to PACU RN and Post -op Vital signs reviewed and stable  Post vital signs: Reviewed and stable  Complications: No apparent anesthesia complications

## 2014-04-24 NOTE — Consult Note (Signed)
I have seen and examined the patient. I agree with the findings above.  I discussed with the patient the risks and benefits of surgery for his right hip fracture, including the possibility of infection, nerve injury, vessel injury, wound breakdown, arthritis, symptomatic hardware, DVT/ PE, loss of motion, and need for further surgery among others.  He understood these risks and wished to proceed.   Rozanna Box, MD 04/24/2014 7:55 AM

## 2014-04-24 NOTE — Progress Notes (Signed)
Patient ID: Russell Mendoza, male   DOB: January 26, 1946, 68 y.o.   MRN: [id number]   LOS: 1 day   Subjective: Doing ok, just c/o right hip pain. Denies N/V.   Objective: Vital signs in last 24 hours: Temp:  [97.5 F (36.4 C)-98.5 F (36.9 C)] 97.7 F (36.5 C) (07/16 1224) Pulse Rate:  [69-98] 97 (07/16 1200) Resp:  [9-23] 15 (07/16 1200) BP: (93-152)/(49-85) 100/63 mmHg (07/16 1200) SpO2:  [94 %-100 %] 100 % (07/16 1200) Weight:  [129 lb 3 oz (58.6 kg)-140 lb (63.504 kg)] 129 lb 3 oz (58.6 kg) (07/15 2200) Last BM Date: 04/22/14   Laboratory  CBC  Recent Labs  04/23/14 1743 04/23/14 1756 04/24/14 0251  WBC 15.3*  --  8.9  HGB 10.0* 11.9* 8.9*  HCT 31.8* 35.0* 28.1*  PLT 361  --  297   BMET  Recent Labs  04/23/14 1743 04/23/14 1756 04/24/14 0251  NA 137 136* 138  K 4.1 4.0 3.8  CL 97 99 101  CO2 24  --  24  GLUCOSE 140* 141* 116*  BUN 13 13 9   CREATININE 0.87 0.90 0.64  CALCIUM 8.7  --  8.3*    Physical Exam General appearance: alert and no distress Resp: clear to auscultation bilaterally Cardio: regular rate and rhythm GI: normal findings: bowel sounds normal and soft, non-tender Extremities: NVI   Assessment/Plan: Fall Ao abnormality on CT -- No other e/o injury. Dr. Cyndia Bent recommended CTA prior to discharge. Right hip fx s/p ORIF -- WBAT ABL anemia -- Check in am Chronic pain -- Narcotic tolerance will complicate pain management. Will add Oxycontin. Multiple medical problems -- home meds FEN -- Give diet VTE -- SCD's, Lovenox Dispo -- Can transfer to floor    Lisette Abu, PA-C Pager: [phone] General Trauma PA Pager: [phone]  04/24/2014

## 2014-04-24 NOTE — Anesthesia Preprocedure Evaluation (Addendum)
Anesthesia Evaluation  Patient identified by MRN, date of birth, ID band Patient awake    Reviewed: Allergy & Precautions, H&P , NPO status , Patient's Chart, lab work & pertinent test results, reviewed documented beta blocker date and time   History of Anesthesia Complications Negative for: history of anesthetic complications  Airway Mallampati: II TM Distance: >3 FB Neck ROM: Full    Dental  (+) Edentulous Upper, Poor Dentition,    Pulmonary Current Smoker,          Cardiovascular negative cardio ROS      Neuro/Psych Anxiety negative neurological ROS  negative psych ROS   GI/Hepatic negative GI ROS, Neg liver ROS,   Endo/Other  negative endocrine ROS  Renal/GU negative Renal ROS     Musculoskeletal   Abdominal   Peds  Hematology negative hematology ROS (+)   Anesthesia Other Findings   Reproductive/Obstetrics                          Anesthesia Physical Anesthesia Plan  ASA: II  Anesthesia Plan: General   Post-op Pain Management:    Induction: Intravenous  Airway Management Planned: Oral ETT  Additional Equipment:   Intra-op Plan:   Post-operative Plan: Extubation in OR  Informed Consent: I have reviewed the patients History and Physical, chart, labs and discussed the procedure including the risks, benefits and alternatives for the proposed anesthesia with the patient or authorized representative who has indicated his/her understanding and acceptance.   Dental advisory given  Plan Discussed with: CRNA, Anesthesiologist and Surgeon  Anesthesia Plan Comments:        Anesthesia Quick Evaluation

## 2014-04-24 NOTE — Anesthesia Procedure Notes (Signed)
Procedure Name: Intubation Date/Time: 04/24/2014 8:19 AM Performed by: Ollen Bowl Pre-anesthesia Checklist: Patient identified, Emergency Drugs available, Suction available, Patient being monitored and Timeout performed Patient Re-evaluated:Patient Re-evaluated prior to inductionOxygen Delivery Method: Circle system utilized and Simple face mask Preoxygenation: Pre-oxygenation with 100% oxygen Intubation Type: IV induction Ventilation: Mask ventilation without difficulty Laryngoscope Size: Mac and 4 Grade View: Grade II Tube type: Oral Tube size: 7.5 mm Number of attempts: 1 Airway Equipment and Method: Patient positioned with wedge pillow and Stylet Placement Confirmation: ETT inserted through vocal cords under direct vision,  positive ETCO2 and breath sounds checked- equal and bilateral Secured at: 23 cm Tube secured with: Tape Dental Injury: Teeth and Oropharynx as per pre-operative assessment

## 2014-04-24 NOTE — Brief Op Note (Signed)
04/23/2014 - 04/24/2014  11:13 AM  PATIENT:  Russell Mendoza  68 y.o. male  PRE-OPERATIVE DIAGNOSIS:  Right Intertroch Fracture  POST-OPERATIVE DIAGNOSIS:  Right Intertroch Fracture  PROCEDURE:  Procedure(s): INTRAMEDULLARY (IM) NAIL INTERTROCHANTRIC (Right) with Biomet Affixus statically locked 11x365mm nail  SURGEON:  Surgeon(s) and Role:    * Rozanna Box, MD - Primary  PHYSICIAN ASSISTANT: None  ANESTHESIA:   general  I/O:  Total I/O In: 5747 [I.V.:1400; IV Piggyback:250] Out: 1150 [Urine:900; Blood:250]  SPECIMEN:  No Specimen  TOURNIQUET:  * No tourniquets in log *  DICTATION: .Other Dictation: Dictation Number [id number]

## 2014-04-25 ENCOUNTER — Encounter (HOSPITAL_COMMUNITY): Payer: Self-pay | Admitting: Orthopedic Surgery

## 2014-04-25 LAB — CBC
HCT: 20.5 % — ABNORMAL LOW (ref 39.0–52.0)
HCT: 20.7 % — ABNORMAL LOW (ref 39.0–52.0)
HEMOGLOBIN: 6.6 g/dL — AB (ref 13.0–17.0)
Hemoglobin: 6.4 g/dL — CL (ref 13.0–17.0)
MCH: 28.7 pg (ref 26.0–34.0)
MCH: 29.3 pg (ref 26.0–34.0)
MCHC: 31.2 g/dL (ref 30.0–36.0)
MCHC: 31.9 g/dL (ref 30.0–36.0)
MCV: 91.9 fL (ref 78.0–100.0)
MCV: 92 fL (ref 78.0–100.0)
PLATELETS: 218 10*3/uL (ref 150–400)
Platelets: 196 10*3/uL (ref 150–400)
RBC: 2.23 MIL/uL — ABNORMAL LOW (ref 4.22–5.81)
RBC: 2.25 MIL/uL — ABNORMAL LOW (ref 4.22–5.81)
RDW: 14.8 % (ref 11.5–15.5)
RDW: 15 % (ref 11.5–15.5)
WBC: 7.7 10*3/uL (ref 4.0–10.5)
WBC: 6.9 10*3/uL (ref 4.0–10.5)

## 2014-04-25 LAB — BASIC METABOLIC PANEL
Anion gap: 11 (ref 5–15)
BUN: 4 mg/dL — ABNORMAL LOW (ref 6–23)
CO2: 25 mEq/L (ref 19–32)
CREATININE: 0.56 mg/dL (ref 0.50–1.35)
Calcium: 7.7 mg/dL — ABNORMAL LOW (ref 8.4–10.5)
Chloride: 103 mEq/L (ref 96–112)
GFR calc Af Amer: >90 mL/min (ref >90)
GFR calc non Af Amer: >90 mL/min (ref >90)
Glucose, Bld: 135 mg/dL — ABNORMAL HIGH (ref 70–99)
POTASSIUM: 3.8 meq/L (ref 3.7–5.3)
Sodium: 139 mEq/L (ref 137–147)

## 2014-04-25 LAB — PREPARE RBC (CROSSMATCH)

## 2014-04-25 MED ORDER — ENSURE COMPLETE PO LIQD
237.0000 mL | Freq: Two times a day (BID) | ORAL | Status: DC
  Administered 2014-04-25 – 2014-04-28 (×7): 237 mL via ORAL

## 2014-04-25 MED ORDER — ZOLPIDEM TARTRATE 5 MG PO TABS
5.0000 mg | ORAL_TABLET | Freq: Every evening | ORAL | Status: DC | PRN
  Administered 2014-04-25: 5 mg via ORAL
  Filled 2014-04-25: qty 1

## 2014-04-25 MED ORDER — OXYCODONE HCL ER 20 MG PO T12A
20.0000 mg | EXTENDED_RELEASE_TABLET | Freq: Once | ORAL | Status: AC
  Administered 2014-04-25: 20 mg via ORAL
  Filled 2014-04-25: qty 2

## 2014-04-25 MED ORDER — OXYCODONE HCL ER 20 MG PO T12A
40.0000 mg | EXTENDED_RELEASE_TABLET | Freq: Two times a day (BID) | ORAL | Status: DC
  Administered 2014-04-25 – 2014-04-28 (×6): 40 mg via ORAL
  Filled 2014-04-25 (×6): qty 4

## 2014-04-25 NOTE — Progress Notes (Signed)
Orthopaedic Trauma Service Progress Note  Subjective  Doing well Pain tolerable Working with therapy    Objective   BP 86/50  Pulse 95  Temp(Src) 98 F (36.7 C) (Oral)  Resp 14  Ht 5' 5.5" (1.664 m)  Wt 58.6 kg (129 lb 3 oz)  BMI 21.16 kg/m2  SpO2 98%  Intake/Output     07/16 0701 - 07/17 0700 07/17 0701 - 07/18 0700   P.O. 0 240   I.V. (mL/kg) 1700 (29)    Blood  12.5   IV Piggyback 250    Total Intake(mL/kg) 1950 (33.3) 252.5 (4.3)   Urine (mL/kg/hr) 2925 (2.1) 300 (0.6)   Blood 250 (0.2)    Total Output 3175 300   Net -1225 -47.5          Labs  Results for ASTIN, SAYRE (MRN [id number]) as of 04/25/2014 15:56  Ref. Range 04/25/2014 07:00  Sodium Latest Range: 137-147 mEq/L 139  Potassium Latest Range: 3.7-5.3 mEq/L 3.8  Chloride Latest Range: 96-112 mEq/L 103  CO2 Latest Range: 19-32 mEq/L 25  BUN Latest Range: 6-23 mg/dL 4 (L)  Creatinine Latest Range: 0.50-1.35 mg/dL 0.56  Calcium Latest Range: 8.4-10.5 mg/dL 7.7 (L)  GFR calc non Af Amer Latest Range: >90 mL/min >90  GFR calc Af Amer Latest Range: >90 mL/min >90  Glucose Latest Range: 70-99 mg/dL 135 (H)  Anion gap Latest Range: 5-15  11  WBC Latest Range: 4.0-10.5 K/uL 7.7  RBC Latest Range: 4.22-5.81 MIL/uL 2.23 (L)  Hemoglobin Latest Range: 13.0-17.0 g/dL 6.4 (LL)  HCT Latest Range: 39.0-52.0 % 20.5 (L)  MCV Latest Range: 78.0-100.0 fL 91.9  MCH Latest Range: 26.0-34.0 pg 28.7  MCHC Latest Range: 30.0-36.0 g/dL 31.2  RDW Latest Range: 11.5-15.5 % 14.8  Platelets Latest Range: 150-400 K/uL 218     Exam  Gen: awake, alert, in bedside chair Ext:     Right Lower Extremity   Dressing stable  Minimal swelling R leg  Distal motor and sensory functions intact  Ext warm  + DP pulse  No DCT   Compartments soft and NT     Assessment and Plan   POD/HD#: 1   68 y/o male s/p fall off ladder  1. Fall of ladder  2. R IT hip fracture s/p IMN  WBAT  No ROM restrictions  Dressing changes  PRN  Ice prn  PT/OT              3. Hx of abdominal surgery               Per TS/general surgery  4. ABL anemia  Transfusion pending   Monitor   6. DVT/PE prophylaxis             SCDs  Lovenox- continue for 21 days after dc   7. FEN             per TS    8. Dispo             Ortho issues stable  Consider CIR consult as pt lives alone      Jari Pigg, PA-C [facility] [phone] (P) 04/25/2014 3:54 PM  **Disclaimer: This note may have been dictated with voice recognition software. Similar sounding words can inadvertently be transcribed and this note may contain transcription errors which may not have been corrected upon publication of note.**

## 2014-04-25 NOTE — Progress Notes (Signed)
After pt has received 20 mg oxycodone q4h, Robaxin q6h, scheduled oxycontin, and morphine q4h regularly throughout the night pts pain is still at a 10/10. Pt repositioned, ice applied, and pt reminded of other relaxation techniques. Nursing will continue to monitor.
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
## 2014-04-25 NOTE — Evaluation (Signed)
I have read and agree with this note.   Time in/out:1243-1313 Total time: 30 minutes Valarie Merino, [place])  [place], OTR/L [phone]
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
## 2014-04-25 NOTE — Progress Notes (Signed)
Patient ID: Russell Mendoza, male   DOB: [birth year], 68 y.o.   MRN: [id number]   LOS: 2 days   Subjective: Awake most of the night with pain. Denies dizziness, nausea.   Objective: Vital signs in last 24 hours: Temp:  [97.5 F (36.4 C)-99.3 F (37.4 C)] 98.9 F (37.2 C) (07/17 0445) Pulse Rate:  [79-116] 116 (07/16 2052) Resp:  [9-23] 14 (07/16 1500) BP: (100-127)/(56-66) 125/66 mmHg (07/17 0445) SpO2:  [91 %-100 %] 98 % (07/17 0445) Last BM Date: 04/22/14   Laboratory  CBC  Recent Labs  04/24/14 1250 04/25/14 0700  WBC 13.7* 7.7  HGB 8.2* 6.4*  HCT 26.6* 20.5*  PLT 261 218   BMET  Recent Labs  04/24/14 0251 04/24/14 1250 04/25/14 0700  NA 138  --  139  K 3.8  --  3.8  CL 101  --  103  CO2 24  --  25  GLUCOSE 116*  --  135*  BUN 9  --  4*  CREATININE 0.64 0.56 0.56  CALCIUM 8.3*  --  7.7*    Physical Exam General appearance: alert and no distress Resp: clear to auscultation bilaterally Cardio: regular rate and rhythm GI: normal findings: bowel sounds normal and soft, non-tender Pulses: 2+ and symmetric   Assessment/Plan: Fall  Ao abnormality on CT -- No other e/o injury. Dr. Cyndia Bent recommended CTA prior to discharge.  Right hip fx s/p ORIF -- WBAT  ABL anemia -- Largely asymptomatic (I note a few e/o tachycardia but normal HR on my exam this am). Recheck this afternoon, would favor not transfusing without risk factors at this point. Chronic pain -- Narcotic tolerance will complicate pain management. Increase Oxycontin.  Multiple medical problems -- home meds  FEN -- Give diet  VTE -- SCD's, Lovenox  Dispo -- D/C tele, awaiting PT/OT consults    Lisette Abu, PA-C Pager: [phone] General Trauma PA Pager: [phone]  04/25/2014

## 2014-04-25 NOTE — Clinical Social Work Placement (Addendum)
Clinical Social Work Department CLINICAL SOCIAL WORK PLACEMENT NOTE 04/25/2014  Patient:  Russell Mendoza, Russell Mendoza  Account Number:  [id number] Admit date:  04/23/2014  Clinical Social Worker:  Barbette Or, LCSW  Date/time:  04/25/2014 05:00 PM  Clinical Social Work is seeking post-discharge placement for this patient at the following level of care:   [place]   (*CSW will update this form in Epic as items are completed)   04/25/2014  Patient/family provided with [place] Department of Clinical Social Work's list of facilities offering this level of care within the geographic area requested by the patient (or if unable, by the patient's family).  04/25/2014  Patient/family informed of their freedom to choose among providers that offer the needed level of care, that participate in Medicare, Medicaid or managed care program needed by the patient, have an available bed and are willing to accept the patient.  04/25/2014  Patient/family informed of [facility]' ownership interest in [facility], as well as of the fact that they are under no obligation to receive care at this facility.  PASARR submitted to EDS on 04/25/2014 PASARR number received on 04/25/2014  FL2 transmitted to all facilities in geographic area requested by pt/family on  04/25/2014 FL2 transmitted to all facilities within larger geographic area on   Patient informed that his/her managed care company has contracts with or will negotiate with  certain facilities, including the following:     Patient/family informed of bed offers received:  04/27/2014  Patient chooses bed at [facility] Physician recommends and patient chooses bed at    Patient to be transferred to [facility] on 04/28/2014   Patient to be transferred to facility by  Ambulance [facility] Patient and family notified of transfer on  04/28/2014 Name of family member notified:  Herbert Seta  [phone]  The following physician request were entered in  Epic:   Additional Comments:
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
## 2014-04-25 NOTE — Progress Notes (Signed)
INITIAL NUTRITION ASSESSMENT  DOCUMENTATION CODES Per approved criteria  -Non-severe (moderate) malnutrition in the context of chronic illness  Pt meets criteria for moderate/non-severe  MALNUTRITION in the context of chronic illness as evidenced by moderate fat and muscle wasting.  INTERVENTION: - Ensure Complete po BID, each supplement provides 350 kcal and 13 grams of protein  NUTRITION DIAGNOSIS: Inadequate oral intake related to pain and prolonged NPO as evidenced by reported intake less than estimated needs and weight loss.   Goal: Pt to meet >/= 90% of their estimated nutrition needs   Monitor:  Weight trends, PO intake, acceptance of supplements, labs  Reason for Assessment: MST-2  68 y.o. male  Admitting Dx: <principal problem not specified>  ASSESSMENT: 31 yom who he says was just released from [facility] after two months for what sounds like a perforated ulcer. He doesn't know many of details about this. He was eating, ambulating and doing well though he said. He returned to his house and he stated that it had been vandalized. He couldn't get in so he climbed a ladder to a window. He then fell.   Pt found to have fx of right hip. Currently on a regular diet. Pt reports that he was in the hospital and underwent three surgeries before accident. He reports a weight loss of 10-15 lbs due to being NPO for surgeries. He reports that his appetite is improving and he is eating now, but he feels that he has lost a lot of muscle. Pt's meal completion is recorded as 50%.   Nutrition Focused Physical Exam:  Subcutaneous Fat:  Orbital Region: moderate wasting Upper Arm Region: WNL Thoracic and Lumbar Region: moderate wasting  Muscle:  Temple Region: moderate wasting Clavicle Bone Region: moderate wasting Clavicle and Acromion Bone Region: moderate wasting Scapular Bone Region: moderate wasting Dorsal Hand: moderate wasting  Edema: none   Height: Ht Readings from  Last 1 Encounters:  04/23/14 5' 5.5" (1.664 m)    Weight: Wt Readings from Last 1 Encounters:  04/23/14 129 lb 3 oz (58.6 kg)    Ideal Body Weight: 62.7 kg  % Ideal Body Weight: 93%  Wt Readings from Last 10 Encounters:  04/23/14 129 lb 3 oz (58.6 kg)  04/23/14 129 lb 3 oz (58.6 kg)    Usual Body Weight: 140 lbs, 6 mos ago  % Usual Body Weight: 92%  BMI:  Body mass index is 21.16 kg/(m^2).  Estimated Nutritional Needs: Kcal: 1610-9604 Protein: 75-85 g Fluid: ~1.8 L/day  Skin: Stage I pressure ulcer on sacrum, incision on right hip  Diet Order: General  EDUCATION NEEDS: -Education needs addressed   Intake/Output Summary (Last 24 hours) at 04/25/14 1355 Last data filed at 04/25/14 1028  Gross per 24 hour  Intake    340 ml  Output   1325 ml  Net   -985 ml    Last BM: none recorded   Labs:   Recent Labs Lab 04/23/14 1743 04/23/14 1756 04/24/14 0251 04/24/14 1250 04/25/14 0700  NA 137 136* 138  --  139  K 4.1 4.0 3.8  --  3.8  CL 97 99 101  --  103  CO2 24  --  24  --  25  BUN 13 13 9   --  4*  CREATININE 0.87 0.90 0.64 0.56 0.56  CALCIUM 8.7  --  8.3*  --  7.7*  GLUCOSE 140* 141* 116*  --  135*    CBG (last 3)  No results found  for this basename: GLUCAP,  in the last 72 hours  Scheduled Meds: . ALPRAZolam  1 mg Oral TID  . docusate sodium  100 mg Oral BID  . enoxaparin (LOVENOX) injection  40 mg Subcutaneous Q24H  . lisinopril  5 mg Oral Daily  . OxyCODONE  40 mg Oral Q12H  . sertraline  25 mg Oral QHS    Continuous Infusions: . sodium chloride 50 mL/hr at 04/24/14 1500    Past Medical History  Diagnosis Date  . Broken arm   . Broken leg   . Chronic pain     Past Surgical History  Procedure Laterality Date  . Abdominal surgery    . Intramedullary (im) nail intertrochanteric Right 04/24/2014    Procedure: INTRAMEDULLARY (IM) NAIL INTERTROCHANTRIC;  Surgeon: Rozanna Box, MD;  Location: [place];  Service: Orthopedics;  Laterality:  Right;    Terrace Arabia RD, LDN

## 2014-04-25 NOTE — Anesthesia Postprocedure Evaluation (Signed)
Anesthesia Post Note  Patient: Russell Mendoza  Procedure(s) Performed: Procedure(s) (LRB): INTRAMEDULLARY (IM) NAIL INTERTROCHANTRIC (Right)  Anesthesia type: general  Patient location: PACU  Post pain: Pain level controlled  Post assessment: Patient's Cardiovascular Status Stable  Post vital signs: Reviewed and stable  Level of consciousness: sedated  Complications: No apparent anesthesia complications

## 2014-04-25 NOTE — Clinical Social Work Note (Signed)
Clinical Social Work Department BRIEF PSYCHOSOCIAL ASSESSMENT 04/25/2014  Patient:  Russell Mendoza, Russell Mendoza     Account Number:  [id number]     Admit date:  04/23/2014  Clinical Social Worker:  Myles Lipps  Date/Time:  04/25/2014 05:30 PM  Referred by:  Physician  Date Referred:  04/25/2014 Referred for  SNF Placement   Other Referral:   Interview type:  Patient Other interview type:   No family/friends at bedside    PSYCHOSOCIAL DATA Living Status:  ALONE Admitted from facility:   Level of care:   Primary support name:  GERARDO, CAIAZZO  [phone] Primary support relationship to patient:  FAMILY Degree of support available:   Adequate    CURRENT CONCERNS Current Concerns  Post-Acute Placement   Other Concerns:    SOCIAL WORK ASSESSMENT / PLAN Clinical Social Worker met with patient at bedside to offer support and discuss patient needs at discharge.  Patient states that he was at [facility] for an extended period of time and then discharged home.  When he returned home, his home had been vandalized and patient was unable to get inside.  Patient got a ladder and attempted to climb through a window, however fell from the ladder and ended up hospitalized.  Patient lives at home alone and is realistic that he is not able to return home alone at this time. Patient agreeable with ST-SNF placement in [facility] prior to return home.  CSW to initiate SNF search and follow up with patient regarding available bed offers.    Clinical Social Worker inquired about current substance use.  Patient states that he does not drink or use drugs. Patient states that he does take Ambien at night since his wife died and cannot sleep with it.  SBIRT completed.  No resources needed at this time.  CSW remains available for support and to facilitate patient discharge needs once medically ready.   Assessment/plan status:  Psychosocial Support/Ongoing Assessment of Needs Other assessment/ plan:    Information/referral to community resources:   Clinical Social Worker to provide patient with facility list once bed offers are available.    PATIENT'S/FAMILY'S RESPONSE TO PLAN OF CARE: Patient alert and oriented x3 laying in bed.  Patient very engaged in conversation and agreeable with PT/OT recommendations for placement prior to return home alone. Patient states that he has limited social support, due to his family living in [place] and [place].  Patient motivated to participate with therapies and encouraged for his recovery. Patient verbalized understanding of social work role and appreciation for involvement and support.

## 2014-04-26 LAB — BASIC METABOLIC PANEL
ANION GAP: 10 (ref 5–15)
BUN: 4 mg/dL — AB (ref 6–23)
CO2: 27 mEq/L (ref 19–32)
Calcium: 7.9 mg/dL — ABNORMAL LOW (ref 8.4–10.5)
Chloride: 101 mEq/L (ref 96–112)
Creatinine, Ser: 0.54 mg/dL (ref 0.50–1.35)
GFR calc non Af Amer: >90 mL/min (ref >90)
GLUCOSE: 98 mg/dL (ref 70–99)
Potassium: 3.9 mEq/L (ref 3.7–5.3)
Sodium: 138 mEq/L (ref 137–147)

## 2014-04-26 LAB — CBC
HEMATOCRIT: 27.1 % — AB (ref 39.0–52.0)
HEMOGLOBIN: 8.8 g/dL — AB (ref 13.0–17.0)
MCH: 29.4 pg (ref 26.0–34.0)
MCHC: 32.5 g/dL (ref 30.0–36.0)
MCV: 90.6 fL (ref 78.0–100.0)
Platelets: 173 10*3/uL (ref 150–400)
RBC: 2.99 MIL/uL — ABNORMAL LOW (ref 4.22–5.81)
RDW: 15.9 % — ABNORMAL HIGH (ref 11.5–15.5)
WBC: 7.6 10*3/uL (ref 4.0–10.5)

## 2014-04-26 LAB — TYPE AND SCREEN
ABO/RH(D): O POS
ANTIBODY SCREEN: NEGATIVE
UNIT DIVISION: 0
UNIT DIVISION: 0

## 2014-04-26 NOTE — Progress Notes (Addendum)
2 Days Post-Op  Subjective: Still very sore.  Objective: Vital signs in last 24 hours: Temp:  [97.7 F (36.5 C)-99.3 F (37.4 C)] 98.2 F (36.8 C) (07/18 0511) Pulse Rate:  [95-100] 95 (07/18 0511) Resp:  [12-16] 16 (07/18 0511) BP: (86-124)/(45-69) 122/69 mmHg (07/18 0511) SpO2:  [94 %-98 %] 94 % (07/18 0511) Last BM Date: 04/22/14  Intake/Output from previous day: 07/17 0701 - 07/18 0700 In: 747.5 [P.O.:720; Blood:27.5] Out: 1275 [Urine:1275] Intake/Output this shift:    PE: General- In NAD CV-RRR Lungs-clear Abdomen-soft, non tender  Lab Results:   Recent Labs  04/25/14 1340 04/26/14 0440  WBC 6.9 7.6  HGB 6.6* 8.8*  HCT 20.7* 27.1*  PLT 196 173   BMET  Recent Labs  04/25/14 0700 04/26/14 0440  NA 139 138  K 3.8 3.9  CL 103 101  CO2 25 27  GLUCOSE 135* 98  BUN 4* 4*  CREATININE 0.56 0.54  CALCIUM 7.7* 7.9*   PT/INR  Recent Labs  04/23/14 1743  LABPROT 14.2  INR 1.10   Comprehensive Metabolic Panel:    Component Value Date/Time   NA 138 04/26/2014 0440   NA 139 04/25/2014 0700   K 3.9 04/26/2014 0440   K 3.8 04/25/2014 0700   CL 101 04/26/2014 0440   CL 103 04/25/2014 0700   CO2 27 04/26/2014 0440   CO2 25 04/25/2014 0700   BUN 4* 04/26/2014 0440   BUN 4* 04/25/2014 0700   CREATININE 0.54 04/26/2014 0440   CREATININE 0.56 04/25/2014 0700   GLUCOSE 98 04/26/2014 0440   GLUCOSE 135* 04/25/2014 0700   CALCIUM 7.9* 04/26/2014 0440   CALCIUM 7.7* 04/25/2014 0700   AST 28 04/23/2014 1743   ALT 23 04/23/2014 1743   ALKPHOS 140* 04/23/2014 1743   BILITOT <0.2* 04/23/2014 1743   PROT 6.9 04/23/2014 1743   ALBUMIN 3.1* 04/23/2014 1743     Studies/Results: Dg Hip Operative Right  04/24/2014   CLINICAL DATA:  ORIF right intertrochanteric fracture  EXAM: DG OPERATIVE RIGHT HIP  FLUOROSCOPY TIME:  1 min 13 seconds  TECHNIQUE: A single spot fluoroscopic AP image of the right hip is submitted.  COMPARISON:  None.  FINDINGS: Interval placement of a right  femoral intramedullary nail and interlocking cannulated femoral neck screw transfixing a right intertrochanteric fracture in near anatomic alignment. No failure or complication. No dislocation.  IMPRESSION: ORIF right intertrochanteric fracture.   Electronically Signed   By: Kathreen Devoid   On: 04/24/2014 10:01   Dg Pelvis Portable  04/24/2014   CLINICAL DATA:  ORIF right intertrochanteric fracture  EXAM: PORTABLE PELVIS 1-2 VIEWS  COMPARISON:  None.  FINDINGS: Interval placement of a right femoral intra medullary nail within interlocking cannulated femoral neck screw transfixing a right intertrochanteric fracture in near anatomic alignment. Postsurgical changes in the surrounding soft tissues. No dislocation. No hardware failure or complication. The distal aspect of the intra medullary nail is excluded from the field of view.  IMPRESSION: ORIF right intertrochanteric fracture.   Electronically Signed   By: Kathreen Devoid   On: 04/24/2014 12:05   Dg Femur Right Port  04/24/2014   CLINICAL DATA:  Post nailing of LEFT femur  EXAM: PORTABLE RIGHT FEMUR - 2 VIEW  COMPARISON:  Portable exam 1120 hr compared to 04/23/2014  FINDINGS: IM nail with compression screw identified aross a reduced intertrochanteric fracture of the RIGHT femur.  Distal locking screw present at IM nail.  No additional fracture or dislocation.  Knee  joint excluded.  Partial visualization of RIGHT hip joint showing no gross dislocation.  IMPRESSION: Post nailing of intertrochanteric fracture RIGHT femur as above.   Electronically Signed   By: Lavonia Dana M.D.   On: 04/24/2014 11:56    Anti-infectives: Anti-infectives   Start     Dose/Rate Route Frequency Ordered Stop   04/24/14 1400  [MAR Hold]  ceFAZolin (ANCEF) IVPB 2 g/50 mL premix     (On MAR Hold since 04/24/14 0730)   2 g 100 mL/hr over 30 Minutes Intravenous  Once 04/23/14 2117 04/24/14 0835   04/24/14 1200  ceFAZolin (ANCEF) IVPB 2 g/50 mL premix     2 g 100 mL/hr over 30  Minutes Intravenous Every 6 hours 04/24/14 1147 04/24/14 1848      Assessment/Plan Fall  Ao abnormality on CT -- No other e/o injury. Dr. Cyndia Bent recommended CTA prior to discharge.  Right hip fx s/p ORIF -- WBAT  ABL anemia --hemoglobin responded well to transfusion Chronic pain -- pain a little better controlled today. Multiple medical problems -- home meds  FEN -- on regular diet VTE -- SCD's, Lovenox  Dispo -- SNF     LOS: 3 days    Tripton Ned J 04/26/2014

## 2014-04-26 NOTE — Progress Notes (Signed)
Orthopaedic Trauma Service Progress Note  Subjective  Doing well Pain tolerable Working with therapy    Objective   BP 122/69  Pulse 95  Temp(Src) 98.2 F (36.8 C) (Oral)  Resp 16  Ht 5' 5.5" (1.664 m)  Wt 58.6 kg (129 lb 3 oz)  BMI 21.16 kg/m2  SpO2 94%  Intake/Output     07/17 0701 - 07/18 0700 07/18 0701 - 07/19 0700   P.O. 720    I.V. (mL/kg)     Blood 27.5    IV Piggyback     Total Intake(mL/kg) 747.5 (12.8)    Urine (mL/kg/hr) 1275 (0.9)    Blood     Total Output 1275     Net -527.5          Stool Occurrence 1 x      Labs  Results for RYLAND, TUNGATE (MRN [id number]) as of 04/25/2014 15:56  Ref. Range 04/25/2014 07:00  Sodium Latest Range: 137-147 mEq/L 139  Potassium Latest Range: 3.7-5.3 mEq/L 3.8  Chloride Latest Range: 96-112 mEq/L 103  CO2 Latest Range: 19-32 mEq/L 25  BUN Latest Range: 6-23 mg/dL 4 (L)  Creatinine Latest Range: 0.50-1.35 mg/dL 0.56  Calcium Latest Range: 8.4-10.5 mg/dL 7.7 (L)  GFR calc non Af Amer Latest Range: >90 mL/min >90  GFR calc Af Amer Latest Range: >90 mL/min >90  Glucose Latest Range: 70-99 mg/dL 135 (H)  Anion gap Latest Range: 5-15  11  WBC Latest Range: 4.0-10.5 K/uL 7.7  RBC Latest Range: 4.22-5.81 MIL/uL 2.23 (L)  Hemoglobin Latest Range: 13.0-17.0 g/dL 6.4 (LL)  HCT Latest Range: 39.0-52.0 % 20.5 (L)  MCV Latest Range: 78.0-100.0 fL 91.9  MCH Latest Range: 26.0-34.0 pg 28.7  MCHC Latest Range: 30.0-36.0 g/dL 31.2  RDW Latest Range: 11.5-15.5 % 14.8  Platelets Latest Range: 150-400 K/uL 218     Exam  Gen: awake, alert, in bedside chair Ext:     Right Lower Extremity   Dressing stable  Minimal swelling R leg  Distal motor and sensory functions intact  Ext warm  + DP pulse  No DCT   Compartments soft and NT     Assessment and Plan   POD/HD#: 1   67 y/o male s/p fall off ladder  1. Fall of ladder  2. R IT hip fracture s/p IMN  WBAT  No ROM restrictions  Dressing changes PRN  Ice  prn  PT/OT              3. Hx of abdominal surgery               Per TS/general surgery  4. ABL anemia  Transfusion pending   Monitor   6. DVT/PE prophylaxis             SCDs  Lovenox- continue for 21 days after dc   7. FEN             per TS    8. Dispo             Ortho issues stable  Consider CIR consult as pt lives alone

## 2014-04-26 NOTE — Progress Notes (Signed)
PT Cancellation Note  Patient Details Name: [name] MRN: [id number] DOB: 1946-02-23   Cancelled Treatment:    Reason Eval/Treat Not Completed: Pain limiting ability to participate. Patient requested to hold to tomorrow if schedules allows.    [name] 04/26/2014, 11:28 AM

## 2014-04-27 LAB — CBC
HCT: 29 % — ABNORMAL LOW (ref 39.0–52.0)
HEMOGLOBIN: 9.4 g/dL — AB (ref 13.0–17.0)
MCH: 29 pg (ref 26.0–34.0)
MCHC: 32.4 g/dL (ref 30.0–36.0)
MCV: 89.5 fL (ref 78.0–100.0)
Platelets: 169 10*3/uL (ref 150–400)
RBC: 3.24 MIL/uL — AB (ref 4.22–5.81)
RDW: 15.5 % (ref 11.5–15.5)
WBC: 7.8 10*3/uL (ref 4.0–10.5)

## 2014-04-27 LAB — VITAMIN D 1,25 DIHYDROXY
Vitamin D 1, 25 (OH)2 Total: 17 pg/mL — ABNORMAL LOW (ref 18–72)
Vitamin D3 1, 25 (OH)2: 17 pg/mL

## 2014-04-27 MED ORDER — ACETAMINOPHEN 325 MG PO TABS
650.0000 mg | ORAL_TABLET | Freq: Four times a day (QID) | ORAL | Status: DC | PRN
  Administered 2014-04-27: 650 mg via ORAL
  Filled 2014-04-27: qty 2

## 2014-04-27 NOTE — Progress Notes (Signed)
Clinical Education officer, museum (CSW) met with patient and gave bed offers. Patient reported that he would like to stay in [place]. Patient has 2 offers in [place] that he is deciding between. CSW encouraged patient to talk with friends and family about offers.   Blima Rich, [place]lyn Weekend CSW [phone]

## 2014-04-27 NOTE — Progress Notes (Signed)
Physical Therapy Treatment Patient Details Name: Russell Mendoza MRN: [id number] DOB: September 30, 1946 Today's Date: 04/27/2014    History of Present Illness 68 y.o. male s/p right intertrochanteric IM nailing.     PT Comments    Pt cooperative with PT today but only agreeable to get up to chair.  Reports walking to bathroom alone early today "I'll probably get cussed out for it".  Reinforced safety with balance, ambulation and role of staff to assist as need to prevent further injury.  Remains appropriate for SNF.  Continuing efforts, mobilizing well when participates.   Follow Up Recommendations  SNF;Supervision for mobility/OOB     Equipment Recommendations  Rolling walker with 5" wheels;3in1 (PT)    Recommendations for Other Services       Precautions / Restrictions Precautions Precautions: Fall Restrictions RLE Weight Bearing: Weight bearing as tolerated    Mobility  Bed Mobility Overal bed mobility: Needs Assistance Bed Mobility: Sit to Supine     Supine to sit: Supervision     General bed mobility comments: attempted to assist pt, he asked to let him do it, needs incr time and verbal cues for technique  Transfers Overall transfer level: Needs assistance Equipment used: Rolling walker (2 wheeled) Transfers: Sit to/from Stand Sit to Stand: Min guard         General transfer comment: pt adamantly requested no physical assist "you'll throw off my balance if you touch me"  instructed on safest hands for st/stand at recliner  Ambulation/Gait Ambulation/Gait assistance: Supervision Ambulation Distance (Feet): 5 Feet Assistive device: Rolling walker (2 wheeled) Gait Pattern/deviations: Step-to pattern   Gait velocity interpretation: Below normal speed for age/gender General Gait Details: instructional cues for safety with device, and pt adamant about performing without assist.  agreeable to chair, but not to hallway   Stairs            Wheelchair Mobility     Modified Rankin (Stroke Patients Only)       Balance Overall balance assessment: Needs assistance;History of Falls Sitting-balance support: No upper extremity supported;Feet supported Sitting balance-[name]: Good     Standing balance support: Bilateral upper extremity supported;During functional activity Standing balance-[name]: Poor Standing balance comment: requires device for standing stability s/p IM nail                    Cognition Arousal/Alertness: Awake/alert Behavior During Therapy: WFL for tasks assessed/performed Overall Cognitive Status: Within Functional Limits for tasks assessed                      Exercises      General Comments        Pertinent Vitals/Pain ''sore" in operated leg, not numerically rated, assured position comfortable     Home Living                      Prior Function            PT Goals (current goals can now be found in the care plan section) Acute Rehab PT Goals PT Goal Formulation: With patient Progress towards PT goals: Progressing toward goals    Frequency  Min 3X/week    PT Plan Current plan remains appropriate    Co-evaluation             End of Session   Activity Tolerance: Patient tolerated treatment well Patient left: in chair;with call bell/phone within reach     Time: 1145-1209 PT  Time Calculation (min): 24 min  Charges:  $Gait Training: 8-22 mins $Therapeutic Activity: 8-22 mins                    G Codes:      [place], Russell Mendoza 04/27/2014, 12:11 PM

## 2014-04-27 NOTE — Progress Notes (Signed)
3 Days Post-Op  Subjective: Pain in head and leg.  Did not work with PT yesterday.  Objective: Vital signs in last 24 hours: Temp:  [99 F (37.2 C)-100.3 F (37.9 C)] 99.7 F (37.6 C) (07/19 0642) Pulse Rate:  [94-96] 94 (07/19 0642) Resp:  [18] 18 (07/19 0642) BP: (117-128)/(63-72) 125/64 mmHg (07/19 0642) SpO2:  [96 %-97 %] 97 % (07/19 0642) Last BM Date: 04/22/14  Intake/Output from previous day: 07/18 0701 - 07/19 0700 In: 1200 [P.O.:1200] Out: 2550 [Urine:2550] Intake/Output this shift: Total I/O In: 240 [P.O.:240] Out: 275 [Urine:275]  PE: General- In NAD CV-RRR Lungs-clear Abdomen-soft, non tender Extr-bandages on right thigh  Lab Results:   Recent Labs  04/26/14 0440 04/27/14 0430  WBC 7.6 7.8  HGB 8.8* 9.4*  HCT 27.1* 29.0*  PLT 173 169   BMET  Recent Labs  04/25/14 0700 04/26/14 0440  NA 139 138  K 3.8 3.9  CL 103 101  CO2 25 27  GLUCOSE 135* 98  BUN 4* 4*  CREATININE 0.56 0.54  CALCIUM 7.7* 7.9*   PT/INR No results found for this basename: LABPROT, INR,  in the last 72 hours Comprehensive Metabolic Panel:    Component Value Date/Time   NA 138 04/26/2014 0440   NA 139 04/25/2014 0700   K 3.9 04/26/2014 0440   K 3.8 04/25/2014 0700   CL 101 04/26/2014 0440   CL 103 04/25/2014 0700   CO2 27 04/26/2014 0440   CO2 25 04/25/2014 0700   BUN 4* 04/26/2014 0440   BUN 4* 04/25/2014 0700   CREATININE 0.54 04/26/2014 0440   CREATININE 0.56 04/25/2014 0700   GLUCOSE 98 04/26/2014 0440   GLUCOSE 135* 04/25/2014 0700   CALCIUM 7.9* 04/26/2014 0440   CALCIUM 7.7* 04/25/2014 0700   AST 28 04/23/2014 1743   ALT 23 04/23/2014 1743   ALKPHOS 140* 04/23/2014 1743   BILITOT <0.2* 04/23/2014 1743   PROT 6.9 04/23/2014 1743   ALBUMIN 3.1* 04/23/2014 1743     Studies/Results: No results found.  Anti-infectives: Anti-infectives   Start     Dose/Rate Route Frequency Ordered Stop   04/24/14 1400  [MAR Hold]  ceFAZolin (ANCEF) IVPB 2 g/50 mL premix     (On MAR  Hold since 04/24/14 0730)   2 g 100 mL/hr over 30 Minutes Intravenous  Once 04/23/14 2117 04/24/14 0835   04/24/14 1200  ceFAZolin (ANCEF) IVPB 2 g/50 mL premix     2 g 100 mL/hr over 30 Minutes Intravenous Every 6 hours 04/24/14 1147 04/24/14 1848      Assessment/Plan Fall  Ao abnormality on CT -- No other e/o injury. Dr. Cyndia Bent recommended CTA prior to discharge.  Right hip fx s/p ORIF -- WBAT  ABL anemia --hemoglobin rising Chronic pain -- continues to be problematic with acute pain on top of it.  Will give Tylenol for headache. Multiple medical problems -- home meds  FEN -- on regular diet VTE -- SCD's, Lovenox  Dispo -- SNF     LOS: 4 days    Amparo Donalson J 04/27/2014

## 2014-04-28 ENCOUNTER — Inpatient Hospital Stay (HOSPITAL_COMMUNITY): Payer: Medicare Other

## 2014-04-28 ENCOUNTER — Encounter (HOSPITAL_COMMUNITY): Payer: Self-pay | Admitting: *Deleted

## 2014-04-28 MED ORDER — ENOXAPARIN SODIUM 40 MG/0.4ML ~~LOC~~ SOLN: 40.0000 mg | SUBCUTANEOUS | Status: DC

## 2014-04-28 MED ORDER — ALPRAZOLAM 1 MG PO TABS: 1.0000 mg | ORAL_TABLET | Freq: Three times a day (TID) | ORAL | Status: DC

## 2014-04-28 MED ORDER — METHOCARBAMOL 500 MG PO TABS: 500.0000 mg | ORAL_TABLET | Freq: Four times a day (QID) | ORAL | Status: DC | PRN

## 2014-04-28 MED ORDER — IOHEXOL 350 MG/ML SOLN
100.0000 mL | Freq: Once | INTRAVENOUS | Status: AC | PRN
  Administered 2014-04-28: 100 mL via INTRAVENOUS

## 2014-04-28 MED ORDER — OXYCODONE-ACETAMINOPHEN 10-325 MG PO TABS: 1.0000 | ORAL_TABLET | ORAL | Status: DC | PRN

## 2014-04-28 MED ORDER — OXYCODONE HCL ER 40 MG PO T12A: 40.0000 mg | EXTENDED_RELEASE_TABLET | Freq: Two times a day (BID) | ORAL | Status: DC

## 2014-04-28 NOTE — Progress Notes (Signed)
Thank you for consult on Mr. Russell Mendoza. Chart and social situation reviewed. Patient does not have medical complexity to justify inpatient CIR.  SNF recommended for follow up therapies and would concur with this. Will defer CIR consult for now.

## 2014-04-28 NOTE — Clinical Social Work Note (Signed)
Clinical Social Worker facilitated patient discharge including contacting patient family and facility to confirm patient discharge plans.  Clinical information faxed to facility and family agreeable with plan.  CSW arranged ambulance transport via [facility] to [facility].  RN to call report prior to discharge.  Clinical Social Worker will sign off for now as social work intervention is no longer needed. Please consult us again if new need arises.  Jesse Santasia Rew, LCSW [phone] 
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
## 2014-04-28 NOTE — Progress Notes (Signed)
Appropriate for SNF at any time.  This patient has been seen and I agree with the findings and treatment plan.  Kathryne Eriksson. Dahlia Bailiff, MD, [place] [phone] (pager) [phone] (direct pager) Trauma Surgeon

## 2014-04-28 NOTE — Progress Notes (Signed)
Medicare IM (Important Message) delivered to patient today by me in anticipation of discharge.

## 2014-04-28 NOTE — Clinical Social Work Note (Signed)
Clinical Social Worker continuing to follow patient for support and discharge planning needs.  Patient has chosen [facility] and is ready for discharge today.  CSW confirmed bed availability with facility.  CSW to facilitate patient discharge needs.  Barbette Or, [place]

## 2014-04-28 NOTE — Progress Notes (Signed)
Patient ID: Russell Mendoza, male   DOB: [birth year], 68 y.o.   MRN: [id number]   LOS: 5 days   Subjective: No new c/o. Ready for discharge.   Objective: Vital signs in last 24 hours: Temp:  [97.7 F (36.5 C)-99.4 F (37.4 C)] 98.1 F (36.7 C) (07/20 0558) Pulse Rate:  [88-93] 88 (07/20 0558) Resp:  [16] 16 (07/20 0558) BP: (103-111)/(59-65) 111/59 mmHg (07/20 0558) SpO2:  [93 %-96 %] 93 % (07/20 0558) Last BM Date: 04/27/14   Physical Exam General appearance: alert and no distress Resp: clear to auscultation bilaterally Cardio: regular rate and rhythm GI: normal findings: bowel sounds normal and soft, non-tender   Assessment/Plan: Fall  Ao abnormality on CT -- No other e/o injury. Dr. Cyndia Bent recommended CTA prior to discharge.  Right hip fx s/p ORIF -- WBAT  ABL anemia -- Stable Chronic pain -- Managed Multiple medical problems -- home meds  FEN -- on regular diet  VTE -- SCD's, Lovenox  Dispo -- SNF today pending CTA    Lisette Abu, PA-C Pager: [phone] General Trauma PA Pager: [phone]  04/28/2014

## 2014-04-29 ENCOUNTER — Other Ambulatory Visit: Payer: Self-pay | Admitting: *Deleted

## 2014-04-29 ENCOUNTER — Non-Acute Institutional Stay (SKILLED_NURSING_FACILITY): Payer: Medicare Other | Admitting: Adult Health

## 2014-04-29 DIAGNOSIS — M150 Primary generalized (osteo)arthritis: Secondary | ICD-10-CM

## 2014-04-29 DIAGNOSIS — M159 Polyosteoarthritis, unspecified: Secondary | ICD-10-CM

## 2014-04-29 DIAGNOSIS — K219 Gastro-esophageal reflux disease without esophagitis: Secondary | ICD-10-CM

## 2014-04-29 DIAGNOSIS — I10 Essential (primary) hypertension: Secondary | ICD-10-CM

## 2014-04-29 DIAGNOSIS — D62 Acute posthemorrhagic anemia: Secondary | ICD-10-CM

## 2014-04-29 DIAGNOSIS — S72001S Fracture of unspecified part of neck of right femur, sequela: Secondary | ICD-10-CM

## 2014-04-29 DIAGNOSIS — S72009S Fracture of unspecified part of neck of unspecified femur, sequela: Secondary | ICD-10-CM

## 2014-04-29 MED ORDER — OXYCODONE HCL ER 40 MG PO T12A: EXTENDED_RELEASE_TABLET | ORAL | Status: DC

## 2014-04-29 MED ORDER — OXYCODONE-ACETAMINOPHEN 10-325 MG PO TABS: 1.0000 | ORAL_TABLET | ORAL | Status: DC | PRN

## 2014-04-29 NOTE — Telephone Encounter (Signed)
[facility] 

## 2014-04-30 ENCOUNTER — Encounter: Payer: Self-pay | Admitting: Adult Health

## 2014-04-30 DIAGNOSIS — I10 Essential (primary) hypertension: Secondary | ICD-10-CM | POA: Insufficient documentation

## 2014-04-30 DIAGNOSIS — K219 Gastro-esophageal reflux disease without esophagitis: Secondary | ICD-10-CM | POA: Insufficient documentation
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
## 2014-05-01 ENCOUNTER — Non-Acute Institutional Stay (SKILLED_NURSING_FACILITY): Payer: Medicare Other | Admitting: Internal Medicine

## 2014-05-01 ENCOUNTER — Encounter: Payer: Self-pay | Admitting: Internal Medicine

## 2014-05-01 DIAGNOSIS — F411 Generalized anxiety disorder: Secondary | ICD-10-CM

## 2014-05-01 DIAGNOSIS — F419 Anxiety disorder, unspecified: Secondary | ICD-10-CM

## 2014-05-01 DIAGNOSIS — I10 Essential (primary) hypertension: Secondary | ICD-10-CM

## 2014-05-01 DIAGNOSIS — F3289 Other specified depressive episodes: Secondary | ICD-10-CM

## 2014-05-01 DIAGNOSIS — S72009S Fracture of unspecified part of neck of unspecified femur, sequela: Secondary | ICD-10-CM

## 2014-05-01 DIAGNOSIS — D62 Acute posthemorrhagic anemia: Secondary | ICD-10-CM

## 2014-05-01 DIAGNOSIS — S72001S Fracture of unspecified part of neck of right femur, sequela: Secondary | ICD-10-CM

## 2014-05-01 DIAGNOSIS — K219 Gastro-esophageal reflux disease without esophagitis: Secondary | ICD-10-CM

## 2014-05-01 DIAGNOSIS — F329 Major depressive disorder, single episode, unspecified: Secondary | ICD-10-CM

## 2014-05-01 DIAGNOSIS — F32A Depression, unspecified: Secondary | ICD-10-CM

## 2014-05-01 NOTE — Progress Notes (Signed)
Patient ID: Russell Mendoza, male   DOB: Feb 17, 1946, 68 y.o.   MRN: [id number]     Facility: [facility]   PCP: No primary provider on file.  Code Status: full code  No Known Allergies  Chief Complaint: new admit  HPI:  68 y/o male patient is here for rehabilitation after hospital admission after a fall with right hip fracture. Orthopedic was consulted and he had IM nailing of the right hip by Dr Marcelino Scot. He also had an anomaly in the region of ascending aorta for which cardiothroacic surgery was consulted and believed it to be an artifact and a repeat ct was recommended. He had blood loss anemia and got blood trnsfusion. Repeat ct angiogram was not concerning. He is here for rehabilitation. He is seen in his room. His pain is not under control with current regimen. Denies muscle spasms or tightness. No falls in facility. He has regular bowel movement. No other concerns  Review of Systems:  Constitutional: Negative for fever, chills, diaphoresis.  HENT: Negative for congestion, hearing loss and sore throat.   Eyes: Negative for eye pain, blurred vision Respiratory: Negative for cough, sputum production, shortness of breath and wheezing.   Cardiovascular: Negative for chest pain, palpitations, orthopnea and leg swelling.  Gastrointestinal: Negative for heartburn, nausea, vomiting, abdominal pain, diarrhea and constipation.  Genitourinary: Negative for dysuria Skin: Negative for itching and rash.  Neurological: Negative for dizziness, tingling, focal weakness and headaches.  Psychiatric/Behavioral: Negative for depression    Past Medical History  Diagnosis Date  . Broken arm   . Broken leg   . Chronic pain   . Fracture of right hip 04/24/2014   Past Surgical History  Procedure Laterality Date  . Abdominal surgery    . Intramedullary (im) nail intertrochanteric Right 04/24/2014    Procedure: INTRAMEDULLARY (IM) NAIL INTERTROCHANTRIC;  Surgeon: Rozanna Box,  MD;  Location: [facility];  Service: Orthopedics;  Laterality: Right;   Social History:   reports that he has been smoking Cigarettes.  He has been smoking about 0.50 packs per day. He does not have any smokeless tobacco history on file. He reports that he does not drink alcohol or use illicit drugs.  No family history on file.  Medications: Patient's Medications  New Prescriptions   No medications on file  Previous Medications   ALPRAZOLAM (XANAX) 1 MG TABLET    Take 1 tablet (1 mg total) by mouth 3 (three) times daily.   ASPIRIN EC 325 MG TABLET    Take 325 mg by mouth daily.   ENOXAPARIN (LOVENOX) 40 MG/0.4ML INJECTION    Inject 0.4 mLs (40 mg total) into the skin daily.   FAMOTIDINE (PEPCID) 20 MG TABLET    Take 20 mg by mouth 2 (two) times daily.   LISINOPRIL (PRINIVIL,ZESTRIL) 5 MG TABLET    Take 5 mg by mouth daily.   METHOCARBAMOL (ROBAXIN) 500 MG TABLET    Take 1 tablet (500 mg total) by mouth every 6 (six) hours as needed for muscle spasms.   OXYCODONE (OXYCONTIN) 40 MG T12A 12 HR TABLET    Take one tablet by mouth every 12 hours for pain. Do not crush   OXYCODONE-ACETAMINOPHEN (PERCOCET) 10-325 MG PER TABLET    Take 1-2 tablets by mouth every 4 (four) hours as needed for pain.   SERTRALINE (ZOLOFT) 25 MG TABLET    Take 25 mg by mouth at bedtime.  Modified Medications   No medications on file  Discontinued  Medications   No medications on file    Physical Exam: Filed Vitals:   05/01/14 1104  BP: 101/65  Pulse: 84  Temp: 98.2 F (36.8 C)  Resp: 18  SpO2: 95%    General- elderly male in no acute distress Head- atraumatic, normocephalic Eyes- no pallor, no icterus, no discharge Neck- no lymphadenopathy Cardiovascular- normal s1,s2, no murmurs Respiratory- bilateral clear to auscultation, no wheeze, no rhonchi, no crackles, no use of accessory muscles Abdomen- bowel sounds present, soft, non tender Musculoskeletal- able to move all 4 extremities, right hip ROM limited, no  leg edema Neurological- no focal deficit Skin- warm and dry, dressing in place and site clean Psychiatry- alert and oriented to person, place and time, normal mood and affect   Labs reviewed: Basic Metabolic Panel:  Recent Labs  04/24/14 0251 04/24/14 1250 04/25/14 0700 04/26/14 0440  NA 138  --  139 138  K 3.8  --  3.8 3.9  CL 101  --  103 101  CO2 24  --  25 27  GLUCOSE 116*  --  135* 98  BUN 9  --  4* 4*  CREATININE 0.64 0.56 0.56 0.54  CALCIUM 8.3*  --  7.7* 7.9*   Liver Function Tests:  Recent Labs  04/23/14 1743  AST 28  ALT 23  ALKPHOS 140*  BILITOT <0.2*  PROT 6.9  ALBUMIN 3.1*   No results found for this basename: LIPASE, AMYLASE,  in the last 8760 hours No results found for this basename: AMMONIA,  in the last 8760 hours CBC:  Recent Labs  04/25/14 1340 04/26/14 0440 04/27/14 0430  WBC 6.9 7.6 7.8  HGB 6.6* 8.8* 9.4*  HCT 20.7* 27.1* 29.0*  MCV 92.0 90.6 89.5  PLT 196 173 169    Radiological Exams: 04-23-14: ct of head and cervical spine: No acute intracranial abnormality.   Atrophy with chronic small vessel white matter ischemic demyelination No evidence for cervical spine fracture with multilevel degenerative changes.  04-23-14: ct of chest; abdomen and pelvis: CT chest: There is a small tear in the aorta at the level of the arch which is not involving the great vessels. 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 There is underlying emphysema. No pneumothorax or lung contusion. No consolidation. No evidence of mediastinal hematoma. No fractures are identified in the thoracic region. CT abdomen and pelvis: There is a comminuted intertrochanteric femur fracture on the right with varus angulation at the fracture site. There is perihepatic fluid without other ascites. The patient had recent ascites following apparent  bowel perforation on prior study. While it is possible that the fluid in the perihepatic region is secondary to the residual ascites seen at the time of the bowel obstruction, the localized nature of the current perihepatic fluid raises concern for potential liver injury. No other liver lesion is appreciable. There is apparent hepatic steatosis. There may be underlying parenchymal liver disease is well Several loops of jejunum show wall thickening. This thickening could represent residua from previous bowel obstruction. The possibility of bowel injury must be of concern. This patient should be followed closely clinically given this possibility. There is a small distal abdominal aortic aneurysm. There are renal cysts bilaterally. No noncystic renal masses are identified.  04-23-14: chest x-ray: No acute abnormalities.  04-23-14: right knee x-ray: Tricompartmental osteoarthritic changes RIGHT knee greatest at lateral compartment.  Proximal tibiofibular joint degenerative changes and osseous demineralization. No acute abnormalities.  04-24-14: right hip x-ray: ORIF right intertrochanteric fracture.   Assessment/Plan  Right hip fracture S/p IM nailing. Pain not under control at present. On oxycontin 40 mg bid with percocet 10-325 q4h prn for now. D/c current oxycontin. Will have him on oxycodone ER 60 mg bid and change percocet to 10-325 1-2 q3 prn for breakthrough pain. Continue robaxin 500 q6 prn for muscle spasm. Continue lovenox for dvt prophylaxis. Will have patient work with PT/OT as tolerated to regain strength and restore function.  Fall precautions are in place. Will add miralax 17 g po daily prn for constipation as we are increasing his pain regimen  Blood loss anemia Post procedure and s/p transfusion. Will recheck cbc in a week  Anxiety Continue xanax 1 mg tid for now  Hypertension continue lisinopril 5 mg daily and asa 325 mg daily   Gerd continue pepcid 20 mg twice  daily  Depression continue zoloft 25 mg daily and monitor  Family/ staff Communication: reviewed care plan with patient and nursing supervisor  Goals of care: short term rehabilitation  Labs/tests ordered: cbc, bmp in 1 week    Blanchie Serve, MD  [place] [phone] (Monday-Friday 8 am - 5 pm) [phone] (afterhours)

## 2014-05-04 NOTE — ED Provider Notes (Signed)
I saw and evaluated the patient, reviewed the resident's note and I agree with the findings and plan.   EKG Interpretation None     Please see my additional dictated note   Tanna Furry, MD 05/04/14 2356

## 2014-05-04 NOTE — ED Provider Notes (Signed)
Pt seen and evaluated.  Discussed with Dr. Silvio Clayman. Exam shows helaing abominal incision.  Lt hip pain and tenderness. IT fx on x-ray.  Ct suggests Ao tear.  Trauma surgery seeing patient, consulting Ct surgery.  Ortho contacted regarding Hp fracture. Pt hemodynamically stable.  I agree with Dr. Selinda Flavin assessment.  Tanna Furry, MD 05/04/14 2224

## 2014-05-05 ENCOUNTER — Non-Acute Institutional Stay (SKILLED_NURSING_FACILITY): Payer: Medicare Other | Admitting: Adult Health

## 2014-05-05 ENCOUNTER — Other Ambulatory Visit: Payer: Self-pay | Admitting: *Deleted

## 2014-05-05 DIAGNOSIS — Z9889 Other specified postprocedural states: Secondary | ICD-10-CM

## 2014-05-05 DIAGNOSIS — R410 Disorientation, unspecified: Secondary | ICD-10-CM

## 2014-05-05 DIAGNOSIS — R6889 Other general symptoms and signs: Secondary | ICD-10-CM

## 2014-05-05 DIAGNOSIS — L905 Scar conditions and fibrosis of skin: Secondary | ICD-10-CM

## 2014-05-05 MED ORDER — ALPRAZOLAM 1 MG PO TABS: ORAL_TABLET | ORAL | Status: DC

## 2014-05-05 NOTE — Telephone Encounter (Signed)
[facility] 

## 2014-05-07 ENCOUNTER — Other Ambulatory Visit: Payer: Self-pay | Admitting: *Deleted

## 2014-05-07 MED ORDER — OXYCODONE HCL ER 60 MG PO T12A: EXTENDED_RELEASE_TABLET | ORAL | Status: DC

## 2014-05-07 NOTE — Telephone Encounter (Signed)
[facility] 

## 2014-05-08 ENCOUNTER — Encounter: Payer: Self-pay | Admitting: Adult Health

## 2014-05-08 DIAGNOSIS — L905 Scar conditions and fibrosis of skin: Secondary | ICD-10-CM | POA: Insufficient documentation

## 2014-05-08 DIAGNOSIS — Z9889 Other specified postprocedural states: Secondary | ICD-10-CM

## 2014-05-08 DIAGNOSIS — R4182 Altered mental status, unspecified: Secondary | ICD-10-CM | POA: Insufficient documentation

## 2014-05-08 NOTE — Progress Notes (Signed)
Patient ID: Russell Mendoza, male   DOB: [birth year], 68 y.o.   MRN: [id number]   [facility] (31)   Full code  Chief Complaint  Patient presents with  . increased confusion    HPI: This is a 68 y.o. Male admitted for STR after an ORIF to the right hip after mechanical fall on the right side of his body.  He as previously admitted to [facility] for a perforated ulcer and he is s/p exp lap. I was asked to see him today for increased confusion, weakness, and complaints of pain to the right side of his body. The complaints of pain are not new. He is on oxycontin 60 mg and xanax 1mg . The staff held these meds this morning due to his mental status.    No Known Allergies  MEDICATIONS -  Reviewed Outpatient Encounter Prescriptions as of 05/05/2014  Medication Sig  . ALPRAZolam (XANAX) 1 MG tablet Take one tablet by mouth three times daily for anxiety  . aspirin EC 325 MG tablet Take 325 mg by mouth daily.  Marland Kitchen enoxaparin (LOVENOX) 40 MG/0.4ML injection Inject 0.4 mLs (40 mg total) into the skin daily.  . famotidine (PEPCID) 20 MG tablet Take 20 mg by mouth 2 (two) times daily.  Marland Kitchen lisinopril (PRINIVIL,ZESTRIL) 5 MG tablet Take 5 mg by mouth daily.  . methocarbamol (ROBAXIN) 500 MG tablet Take 1 tablet (500 mg total) by mouth every 6 (six) hours as needed for muscle spasms.  . OxyCODONE (OXYCONTIN) 40 mg T12A 12 hr tablet Take one tablet by mouth every 12 hours for pain. Do not crush  . oxyCODONE-acetaminophen (PERCOCET) 10-325 MG per tablet Take 1-2 tablets by mouth every 4 (four) hours as needed for pain.  Marland Kitchen sertraline (ZOLOFT) 25 MG tablet Take 25 mg by mouth at bedtime.    Laboratory Studies: Lab Results  Component Value Date   WBC 7.8 04/27/2014   HGB 9.4* 04/27/2014   HCT 29.0* 04/27/2014   MCV 89.5 04/27/2014   PLT 169 04/27/2014     Chemistry      Component Value Date/Time   NA 138 04/26/2014 0440   K 3.9 04/26/2014 0440   CL 101 04/26/2014 0440   CO2 27  04/26/2014 0440   BUN 4* 04/26/2014 0440   CREATININE 0.54 04/26/2014 0440      Component Value Date/Time   CALCIUM 7.9* 04/26/2014 0440   ALKPHOS 140* 04/23/2014 1743   AST 28 04/23/2014 1743   ALT 23 04/23/2014 1743   BILITOT <0.2* 04/23/2014 1743      REVIEW OF SYSTEMS   DATA OBTAINED: from patient, nurse, medical record, he is a poor historian today GENERAL: "I hurt on the right side"  No recent fever, increased weakness and not able to get OOB RESPIRATORY: No cough, wheezing, SOB CARDIAC: No chest pain, palpitations. No edema GI: No abdominal pain  No Nausea,vomiting,diarrhea or constipation  No heartburn or reflux  MUSCULOSKELETAL: Report pain to the right side of the body because this is the side he fell on NEUROLOGIC: No dizziness, fainting, headache, numbness. Increasing sleepy and confused per staff PSYCHIATRIC: No feelings of anxiety, depression  Sleeps well   No behavior issue  PHYSICAL EXAM Filed Vitals:   05/08/14 1508  BP: 110/71  Pulse: 79  Temp: 97.9 F (36.6 C)  Resp: 20   There is no weight on file to calculate BMI. GENERAL APPEARANCE: No acute distress, appropriately groomed, normal body habitus. Groggy and confused  SKIN: No diaphoresis or rash. Right hip incision CDI.  Midline incision mostly healed accept for 1cm area in the center with yellow drainage, wound bed pink HEAD: Normocephalic, atraumatic EYES: Conjunctiva/lids clear. Pupils round, reactive. EOMs intact.  NOSE: No deformity or discharge. MOUTH/THROAT: Lips w/o lesions. Oral mucosa, tongue moist, w/o lesion. Oropharynx w/o redness or lesions.  NECK: Supple, full ROM. No thyroid tenderness, enlargement or nodule LYMPHATICS: No head, neck or supraclavicular adenopathy RESPIRATORY: Breathing is even, unlabored. Lung sounds are clear and full.  CARDIOVASCULAR: Heart RRR. No murmur or extra heart sounds  ARTERIAL: BPPP+2  VENOUS: No varicosities. No venous stasis skin changes  EDEMA: No peripheral  edema. No ascites GASTROINTESTINAL: Abdomen is soft, non-tender, not distended w/ normal bowel sounds. No hepatic or splenic enlargement. No mass, ventral or inguinal hernia. MUSCULOSKELETAL: Moves all extremities with full ROM, strength and tone except right hip. Back is without kyphosis, scoliosis or spinal process tenderness NEUROLOGIC: Oriented to person only. Did not understand why he was here or what happened to him. CN 2-12 intact. PSYCHIATRIC: Mood and affect appropriate to situation  ASSESSMENT/PLAN  Altered mental status He does not have a focal deficit. I am concerned that this may be due to his opiates and benzos. I have asked the staff to hold these until he is more alert. We will ordered a CBC and CMP. Continue to monitor VS closely.  Exploratory laparotomy scar I removed the discolored drainage to this mid abd and the small area of this wound that is not healed appeared to be pink and healthy. I have asked the staff to dress the wound and have the wound care nurse follow him.     Bard Herbert, NP/Christina Melvyn Novas RN, MSN 05/08/2014

## 2014-05-08 NOTE — Assessment & Plan Note (Signed)
I removed the discolored drainage to this mid abd and the small area of this wound that is not healed appeared to be pink and healthy. I have asked the staff to dress the wound and have the wound care nurse follow him.

## 2014-05-08 NOTE — Assessment & Plan Note (Signed)
He does not have a focal deficit. I am concerned that this may be due to his opiates and benzos. I have asked the staff to hold these until he is more alert. We will ordered a CBC and CMP. Continue to monitor VS closely.

## 2014-05-09 NOTE — Progress Notes (Signed)
I have seen and examined the patient. I agree with the findings above.  Densel Kronick H, MD  

## 2014-05-23 ENCOUNTER — Non-Acute Institutional Stay (SKILLED_NURSING_FACILITY): Payer: Medicare Other | Admitting: Adult Health

## 2014-05-23 DIAGNOSIS — M150 Primary generalized (osteo)arthritis: Secondary | ICD-10-CM

## 2014-05-23 DIAGNOSIS — M159 Polyosteoarthritis, unspecified: Secondary | ICD-10-CM

## 2014-05-23 DIAGNOSIS — S72009S Fracture of unspecified part of neck of unspecified femur, sequela: Secondary | ICD-10-CM

## 2014-05-23 DIAGNOSIS — F418 Other specified anxiety disorders: Secondary | ICD-10-CM

## 2014-05-23 DIAGNOSIS — F411 Generalized anxiety disorder: Secondary | ICD-10-CM

## 2014-05-23 DIAGNOSIS — S72001S Fracture of unspecified part of neck of right femur, sequela: Secondary | ICD-10-CM

## 2014-05-23 DIAGNOSIS — I10 Essential (primary) hypertension: Secondary | ICD-10-CM

## 2014-05-27 ENCOUNTER — Other Ambulatory Visit: Payer: Self-pay | Admitting: *Deleted

## 2014-05-27 MED ORDER — OXYCODONE-ACETAMINOPHEN 10-325 MG PO TABS: ORAL_TABLET | ORAL | Status: DC

## 2014-05-27 NOTE — Telephone Encounter (Signed)
[facility] 

## 2014-06-02 DIAGNOSIS — F411 Generalized anxiety disorder: Secondary | ICD-10-CM

## 2014-06-02 DIAGNOSIS — D649 Anemia, unspecified: Secondary | ICD-10-CM

## 2014-06-02 DIAGNOSIS — M1990 Unspecified osteoarthritis, unspecified site: Secondary | ICD-10-CM

## 2014-06-02 DIAGNOSIS — S72009D Fracture of unspecified part of neck of unspecified femur, subsequent encounter for closed fracture with routine healing: Secondary | ICD-10-CM

## 2014-06-10 NOTE — Progress Notes (Signed)
Patient ID: Russell Roussel, male   DOB: [birth year], 68 y.o.   MRN: [id number]     [facility]  No Known Allergies   Chief Complaint  Patient presents with  . Discharge Note    HPI:  He is being discharged to home with home health for pt/ot/rn for medication management improved gait increased strength and independence with adl's.  He will need a front wheel walker. He will need prescriptions to be written. His follow up appointment has been setup.    Past Medical History  Diagnosis Date  . Broken arm   . Broken leg   . Chronic pain   . Fracture of right hip 04/24/2014    Past Surgical History  Procedure Laterality Date  . Abdominal surgery    . Intramedullary (im) nail intertrochanteric Right 04/24/2014    Procedure: INTRAMEDULLARY (IM) NAIL INTERTROCHANTRIC;  Surgeon: Rozanna Box, MD;  Location: [place];  Service: Orthopedics;  Laterality: Right;    VITAL SIGNS BP 119/80  Pulse 78  Ht 5' 5.5" (1.664 m)  Wt 125 lb (56.7 kg)  BMI 20.48 kg/m2   Patient's Medications  New Prescriptions   No medications on file  Previous Medications   ALPRAZOLAM (XANAX) 1 MG TABLET    Take one tablet by mouth three times daily for anxiety   ASPIRIN EC 325 MG TABLET    Take 325 mg by mouth daily.   ENOXAPARIN (LOVENOX) 40 MG/0.4ML INJECTION    Inject 0.4 mLs (40 mg total) into the skin daily.   FAMOTIDINE (PEPCID) 20 MG TABLET    Take 20 mg by mouth 2 (two) times daily.   LISINOPRIL (PRINIVIL,ZESTRIL) 5 MG TABLET    Take 5 mg by mouth daily.   METHOCARBAMOL (ROBAXIN) 500 MG TABLET    Take 1 tablet (500 mg total) by mouth every 6 (six) hours as needed for muscle spasms.   OXYCODONE (OXYCONTIN) 40 MG T12A 12 HR TABLET    Take one tablet by mouth every 12 hours for pain. Do not crush   OXYCODONE HCL ER (OXYCONTIN) 60 MG T12A    Take one tablet by mouth twice daily as needed for pain. Hold for sedation. Do not crush   OXYCODONE-ACETAMINOPHEN (PERCOCET) 10-325 MG PER TABLET    Take one tablet  by mouth every 3 hours as needed for pain 1-5/10; Take two tablets by mouth every 3 hours as needed for pain 6-10/10   SERTRALINE (ZOLOFT) 25 MG TABLET    Take 25 mg by mouth at bedtime.  Modified Medications   No medications on file  Discontinued Medications   No medications on file    SIGNIFICANT DIAGNOSTIC EXAMS   04-23-14: ct of head and cervical spine: No acute intracranial abnormality.   Atrophy with chronic small vessel white matter ischemic demyelination No evidence for cervical spine fracture with multilevel degenerative changes.  04-23-14: ct of chest; abdomen and pelvis: CT chest: There is a small tear in the aorta at the level of the arch which is not involving the great vessels. 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 There is underlying emphysema. No pneumothorax or lung contusion. No consolidation. No evidence of mediastinal hematoma. No fractures are identified in the thoracic region. CT abdomen and pelvis: There is a comminuted intertrochanteric femur fracture on the right with varus angulation at the fracture site. There is  perihepatic fluid without other ascites. The patient had recent ascites following apparent bowel perforation on prior study. While it is possible that the fluid in the perihepatic region is secondary to the residual ascites seen at the time of the bowel obstruction, the localized nature of the current perihepatic fluid raises concern for potential liver injury. No other liver lesion is appreciable. There is apparent hepatic steatosis. There may be underlying parenchymal liver disease is well Several loops of jejunum show wall thickening. This thickening could represent residua from previous bowel obstruction. The possibility of bowel injury must be of concern. This patient should be followed closely clinically given this  possibility. There is a small distal abdominal aortic aneurysm. There are renal cysts bilaterally. No noncystic renal masses are identified.  04-23-14: chest x-ray: No acute abnormalities.  04-23-14: right knee x-ray: Tricompartmental osteoarthritic changes RIGHT knee greatest at lateral compartment. Proximal tibiofibular joint degenerative changes and osseous demineralization. No acute abnormalities.  04-24-14: right hip x-ray: ORIF right intertrochanteric fracture.     LABS REVIEWED:   04-1514: wbc 15.3; hgb 10.0; hct 31.8; mcv 93.5; plt 361; glucose 140; bun 13; creat 0.87; k+4.1; na++137; alk phos 140; albumin 3.1 04-24-14: vit d 21 04-26-14; wbc 7.6;  hgb 8.8; hct 27.1; mcv 90.6; plt 173; glucose 98; bun 4; creat 0.54; k+3.9; na++138  05-06-14: wbc 6.1; hgb 9.4; hct 33.7; mcv 101.5; plt 446; glucose 95; bun 12; creat 0.8 ;k+4.3; na++139 liver normal albumin 3.5     Review of Systems  Constitutional: Negative for malaise/fatigue.  Respiratory: Negative for cough and shortness of breath.   Cardiovascular: Negative for chest pain and leg swelling.  Gastrointestinal: Negative for heartburn, abdominal pain and constipation.  Musculoskeletal: Positive for joint pain and myalgias.       Has right sided pain   Skin: Negative.   Psychiatric/Behavioral: Negative for depression. The patient is not nervous/anxious.      Physical Exam  Constitutional: He is oriented to person, place, and time. No distress.  frail  Neck: Neck supple. No JVD present.  Cardiovascular: Normal rate, regular rhythm and intact distal pulses.   Respiratory: Effort normal and breath sounds normal. No respiratory distress. He has no wheezes.  GI: Soft. Bowel sounds are normal. He exhibits no distension. There is no tenderness.  Musculoskeletal: He exhibits no edema.  Is able to move all extremities is status post right hip fracture.   Neurological: He is alert and oriented to person, place, and time.  Skin: Skin is  warm and dry. He is not diaphoretic.   Psychiatric: He has a normal mood and affect.      ASSESSMENT/ PLAN:  Will discharge him to home with home health for pt/ot/rn for medication management, improvement upon gait, increased strength and independence with adl's. Will need a front wheel walker. His prescriptions have been written with xanax 1 mg #90; oxycontin 60 mg #60 and percocet 10/325 mg #30.  He has a follow up appointment setup with his pcp.    Time spent with patient 40 minutes.

## 2015-01-17 ENCOUNTER — Inpatient Hospital Stay (HOSPITAL_COMMUNITY)
Admission: EM | Admit: 2015-01-17 | Discharge: 2015-01-24 | DRG: 355 | Disposition: A | Discharge status: Home Health | Payer: Medicare Other | Attending: Family Medicine | Admitting: Family Medicine

## 2015-01-17 ENCOUNTER — Encounter (HOSPITAL_COMMUNITY): Payer: Self-pay | Admitting: Emergency Medicine

## 2015-01-17 ENCOUNTER — Emergency Department (HOSPITAL_COMMUNITY): Payer: Medicare Other

## 2015-01-17 DIAGNOSIS — Z7952 Long term (current) use of systemic steroids: Secondary | ICD-10-CM | POA: Diagnosis not present

## 2015-01-17 DIAGNOSIS — Z789 Other specified health status: Secondary | ICD-10-CM | POA: Diagnosis present

## 2015-01-17 DIAGNOSIS — K219 Gastro-esophageal reflux disease without esophagitis: Secondary | ICD-10-CM | POA: Diagnosis present

## 2015-01-17 DIAGNOSIS — K430 Incisional hernia with obstruction, without gangrene: Secondary | ICD-10-CM | POA: Diagnosis present

## 2015-01-17 DIAGNOSIS — F419 Anxiety disorder, unspecified: Secondary | ICD-10-CM | POA: Diagnosis present

## 2015-01-17 DIAGNOSIS — I714 Abdominal aortic aneurysm, without rupture, unspecified: Secondary | ICD-10-CM | POA: Diagnosis present

## 2015-01-17 DIAGNOSIS — Z7951 Long term (current) use of inhaled steroids: Secondary | ICD-10-CM

## 2015-01-17 DIAGNOSIS — F17210 Nicotine dependence, cigarettes, uncomplicated: Secondary | ICD-10-CM | POA: Diagnosis present

## 2015-01-17 DIAGNOSIS — K56600 Partial intestinal obstruction, unspecified as to cause: Secondary | ICD-10-CM

## 2015-01-17 DIAGNOSIS — K469 Unspecified abdominal hernia without obstruction or gangrene: Secondary | ICD-10-CM | POA: Insufficient documentation

## 2015-01-17 DIAGNOSIS — K439 Ventral hernia without obstruction or gangrene: Secondary | ICD-10-CM

## 2015-01-17 DIAGNOSIS — Z7902 Long term (current) use of antithrombotics/antiplatelets: Secondary | ICD-10-CM | POA: Diagnosis not present

## 2015-01-17 DIAGNOSIS — K5669 Other intestinal obstruction: Secondary | ICD-10-CM | POA: Diagnosis not present

## 2015-01-17 DIAGNOSIS — R1013 Epigastric pain: Secondary | ICD-10-CM

## 2015-01-17 DIAGNOSIS — K279 Peptic ulcer, site unspecified, unspecified as acute or chronic, without hemorrhage or perforation: Secondary | ICD-10-CM | POA: Diagnosis present

## 2015-01-17 DIAGNOSIS — Z79891 Long term (current) use of opiate analgesic: Secondary | ICD-10-CM

## 2015-01-17 DIAGNOSIS — F418 Other specified anxiety disorders: Secondary | ICD-10-CM

## 2015-01-17 DIAGNOSIS — M069 Rheumatoid arthritis, unspecified: Secondary | ICD-10-CM | POA: Diagnosis present

## 2015-01-17 DIAGNOSIS — J449 Chronic obstructive pulmonary disease, unspecified: Secondary | ICD-10-CM | POA: Diagnosis present

## 2015-01-17 DIAGNOSIS — K5900 Constipation, unspecified: Secondary | ICD-10-CM

## 2015-01-17 DIAGNOSIS — M1990 Unspecified osteoarthritis, unspecified site: Secondary | ICD-10-CM | POA: Diagnosis present

## 2015-01-17 DIAGNOSIS — R112 Nausea with vomiting, unspecified: Secondary | ICD-10-CM | POA: Diagnosis present

## 2015-01-17 DIAGNOSIS — I10 Essential (primary) hypertension: Secondary | ICD-10-CM | POA: Diagnosis present

## 2015-01-17 DIAGNOSIS — T50905A Adverse effect of unspecified drugs, medicaments and biological substances, initial encounter: Secondary | ICD-10-CM

## 2015-01-17 DIAGNOSIS — Z7982 Long term (current) use of aspirin: Secondary | ICD-10-CM

## 2015-01-17 DIAGNOSIS — Z79899 Other long term (current) drug therapy: Secondary | ICD-10-CM

## 2015-01-17 LAB — COMPREHENSIVE METABOLIC PANEL
ALT: 10 U/L (ref 0–53)
ANION GAP: 11 (ref 5–15)
AST: 16 U/L (ref 0–37)
Albumin: 4 g/dL (ref 3.5–5.2)
Alkaline Phosphatase: 121 U/L — ABNORMAL HIGH (ref 39–117)
BILIRUBIN TOTAL: 0.6 mg/dL (ref 0.3–1.2)
BUN: 9 mg/dL (ref 6–23)
CO2: 24 mmol/L (ref 19–32)
Calcium: 9.1 mg/dL (ref 8.4–10.5)
Chloride: 99 mmol/L (ref 96–112)
Creatinine, Ser: 0.87 mg/dL (ref 0.50–1.35)
GFR calc Af Amer: >90 mL/min (ref >90)
GFR calc non Af Amer: 86 mL/min — ABNORMAL LOW (ref >90)
Glucose, Bld: 113 mg/dL — ABNORMAL HIGH (ref 70–99)
POTASSIUM: 3.6 mmol/L (ref 3.5–5.1)
Sodium: 134 mmol/L — ABNORMAL LOW (ref 135–145)
TOTAL PROTEIN: 7.5 g/dL (ref 6.0–8.3)

## 2015-01-17 LAB — CBC WITH DIFFERENTIAL/PLATELET
Basophils Absolute: 0.1 10*3/uL (ref 0.0–0.1)
Basophils Relative: 1 % (ref 0–1)
EOS PCT: 2 % (ref 0–5)
Eosinophils Absolute: 0.2 10*3/uL (ref 0.0–0.7)
HEMATOCRIT: 34.7 % — AB (ref 39.0–52.0)
HEMOGLOBIN: 10.8 g/dL — AB (ref 13.0–17.0)
LYMPHS ABS: 1.5 10*3/uL (ref 0.7–4.0)
Lymphocytes Relative: 14 % (ref 12–46)
MCH: 26.9 pg (ref 26.0–34.0)
MCHC: 31.1 g/dL (ref 30.0–36.0)
MCV: 86.3 fL (ref 78.0–100.0)
MONO ABS: 0.8 10*3/uL (ref 0.1–1.0)
Monocytes Relative: 7 % (ref 3–12)
NEUTROS PCT: 76 % (ref 43–77)
Neutro Abs: 8.4 10*3/uL — ABNORMAL HIGH (ref 1.7–7.7)
PLATELETS: 372 10*3/uL (ref 150–400)
RBC: 4.02 MIL/uL — ABNORMAL LOW (ref 4.22–5.81)
RDW: 15.1 % (ref 11.5–15.5)
WBC: 10.9 10*3/uL — AB (ref 4.0–10.5)

## 2015-01-17 LAB — URINALYSIS, ROUTINE W REFLEX MICROSCOPIC
BILIRUBIN URINE: NEGATIVE
Glucose, UA: NEGATIVE mg/dL
Hgb urine dipstick: NEGATIVE
Ketones, ur: NEGATIVE mg/dL
Leukocytes, UA: NEGATIVE
NITRITE: NEGATIVE
PH: 6.5 (ref 5.0–8.0)
PROTEIN: NEGATIVE mg/dL
SPECIFIC GRAVITY, URINE: 1.006 (ref 1.005–1.030)
Urobilinogen, UA: 0.2 mg/dL (ref 0.0–1.0)

## 2015-01-17 LAB — TROPONIN I

## 2015-01-17 LAB — I-STAT CG4 LACTIC ACID, ED: Lactic Acid, Venous: 0.89 mmol/L (ref 0.5–2.0)

## 2015-01-17 LAB — CBG MONITORING, ED: Glucose-Capillary: 118 mg/dL — ABNORMAL HIGH (ref 70–99)

## 2015-01-17 LAB — I-STAT TROPONIN, ED: TROPONIN I, POC: 0 ng/mL (ref 0.00–0.08)

## 2015-01-17 LAB — LIPASE, BLOOD: Lipase: 41 U/L (ref 11–59)

## 2015-01-17 MED ORDER — ENOXAPARIN SODIUM 40 MG/0.4ML ~~LOC~~ SOLN
40.0000 mg | SUBCUTANEOUS | Status: DC
Start: 2015-01-17 — End: 2015-01-19
  Administered 2015-01-17 – 2015-01-18 (×2): 40 mg via SUBCUTANEOUS
  Filled 2015-01-17 (×3): qty 0.4

## 2015-01-17 MED ORDER — HYDROMORPHONE HCL 1 MG/ML IJ SOLN
1.0000 mg | Freq: Once | INTRAMUSCULAR | Status: AC
  Administered 2015-01-17: 1 mg via INTRAVENOUS
  Filled 2015-01-17: qty 1

## 2015-01-17 MED ORDER — ACETAMINOPHEN 325 MG PO TABS
650.0000 mg | ORAL_TABLET | Freq: Four times a day (QID) | ORAL | Status: DC | PRN
  Administered 2015-01-18 (×4): 650 mg via ORAL
  Filled 2015-01-17 (×4): qty 2

## 2015-01-17 MED ORDER — HYDRALAZINE HCL 20 MG/ML IJ SOLN
10.0000 mg | Freq: Four times a day (QID) | INTRAMUSCULAR | Status: DC | PRN
  Administered 2015-01-19: 10 mg via INTRAVENOUS

## 2015-01-17 MED ORDER — POLYETHYLENE GLYCOL 3350 17 G PO PACK
17.0000 g | PACK | Freq: Two times a day (BID) | ORAL | Status: DC
  Administered 2015-01-17 – 2015-01-18 (×3): 17 g via ORAL
  Filled 2015-01-17 (×3): qty 1

## 2015-01-17 MED ORDER — ONDANSETRON HCL 4 MG PO TABS: 4.0000 mg | ORAL_TABLET | Freq: Four times a day (QID) | ORAL | Status: DC | PRN

## 2015-01-17 MED ORDER — LORAZEPAM 0.5 MG PO TABS
0.5000 mg | ORAL_TABLET | Freq: Once | ORAL | Status: AC
  Administered 2015-01-18: 0.5 mg via ORAL
  Filled 2015-01-17: qty 1

## 2015-01-17 MED ORDER — FENTANYL CITRATE 0.05 MG/ML IJ SOLN
50.0000 ug | Freq: Once | INTRAMUSCULAR | Status: AC
  Administered 2015-01-17: 50 ug via INTRAVENOUS
  Filled 2015-01-17: qty 2

## 2015-01-17 MED ORDER — SODIUM CHLORIDE 0.9 % IV BOLUS (SEPSIS)
1000.0000 mL | Freq: Once | INTRAVENOUS | Status: AC
  Administered 2015-01-17: 1000 mL via INTRAVENOUS

## 2015-01-17 MED ORDER — ALUM & MAG HYDROXIDE-SIMETH 200-200-20 MG/5ML PO SUSP
30.0000 mL | Freq: Four times a day (QID) | ORAL | Status: DC | PRN
  Administered 2015-01-21 (×2): 30 mL via ORAL
  Filled 2015-01-17 (×3): qty 30

## 2015-01-17 MED ORDER — HYDRALAZINE HCL 20 MG/ML IJ SOLN
10.0000 mg | Freq: Once | INTRAMUSCULAR | Status: AC
Start: 2015-01-17 — End: 2015-01-17
  Administered 2015-01-17: 10 mg via INTRAVENOUS
  Filled 2015-01-17: qty 1

## 2015-01-17 MED ORDER — ONDANSETRON HCL 4 MG/2ML IJ SOLN
4.0000 mg | Freq: Four times a day (QID) | INTRAMUSCULAR | Status: DC | PRN
  Administered 2015-01-18: 4 mg via INTRAVENOUS
  Filled 2015-01-17: qty 2

## 2015-01-17 MED ORDER — FLEET ENEMA 7-19 GM/118ML RE ENEM
1.0000 | ENEMA | Freq: Once | RECTAL | Status: AC
  Administered 2015-01-18: 1 via RECTAL
  Filled 2015-01-17 (×2): qty 1

## 2015-01-17 MED ORDER — CEFAZOLIN SODIUM-DEXTROSE 2-3 GM-% IV SOLR
2.0000 g | INTRAVENOUS | Status: AC
  Filled 2015-01-17: qty 50

## 2015-01-17 MED ORDER — HYDROMORPHONE HCL 1 MG/ML IJ SOLN
2.0000 mg | INTRAMUSCULAR | Status: DC | PRN
  Administered 2015-01-17 – 2015-01-18 (×2): 2 mg via INTRAVENOUS
  Administered 2015-01-18: 1 mg via INTRAVENOUS
  Administered 2015-01-18 – 2015-01-20 (×7): 2 mg via INTRAVENOUS
  Filled 2015-01-17 (×10): qty 2

## 2015-01-17 MED ORDER — SODIUM CHLORIDE 0.9 % IV SOLN
INTRAVENOUS | Status: DC
  Administered 2015-01-17 – 2015-01-18 (×2): via INTRAVENOUS

## 2015-01-17 MED ORDER — IOHEXOL 300 MG/ML  SOLN
50.0000 mL | Freq: Once | INTRAMUSCULAR | Status: AC | PRN
  Administered 2015-01-17: 50 mL via ORAL

## 2015-01-17 NOTE — ED Notes (Signed)
Per EMS- pt had abdominal surgery 6 months ago when he was coughing up blood. Pt has abdominal distention with last BM two days ago. Pt c.o substernal chest pain, abdominal pain for three days. Mild nausea. Gvien 4 of zofran in route. 20G PIV to L hand.

## 2015-01-17 NOTE — H&P (Signed)
Russell Mendoza is an 69 y.o. male.   Chief Complaint: abdominal pain and bulge HPI: pt has a 3 day history of abdominal pain epigastric and bulge at old incisional site for 3 days.  Pain is sharp constant midline and 7/10.  Had last BM 2 days ago and passing gas. Had ex lap at [facility] last year for GIB but unclear on details  No ETOH use but is a smoker. Loss of appetite but no vomiting.   Past Medical History  Diagnosis Date  . Broken arm   . Broken leg   . Chronic pain   . Fracture of right hip 04/24/2014    Past Surgical History  Procedure Laterality Date  . Abdominal surgery    . Intramedullary (im) nail intertrochanteric Right 04/24/2014    Procedure: INTRAMEDULLARY (IM) NAIL INTERTROCHANTRIC;  Surgeon: Rozanna Box, MD;  Location: [place];  Service: Orthopedics;  Laterality: Right;    History reviewed. No pertinent family history. Social History:  reports that he has been smoking Cigarettes.  He has been smoking about 0.50 packs per day. He does not have any smokeless tobacco history on file. He reports that he does not drink alcohol or use illicit drugs.  Allergies: No Known Allergies   (Not in a hospital admission)  Results for orders placed or performed during the hospital encounter of 01/17/15 (from the past 48 hour(s))  POC CBG, ED     Status: Abnormal   Collection Time: 01/17/15  1:34 PM  Result Value Ref Range   Glucose-Capillary 118 (H) 70 - 99 mg/dL  CBC with Differential     Status: Abnormal   Collection Time: 01/17/15  1:35 PM  Result Value Ref Range   WBC 10.9 (H) 4.0 - 10.5 K/uL   RBC 4.02 (L) 4.22 - 5.81 MIL/uL   Hemoglobin 10.8 (L) 13.0 - 17.0 g/dL   HCT 34.7 (L) 39.0 - 52.0 %   MCV 86.3 78.0 - 100.0 fL   MCH 26.9 26.0 - 34.0 pg   MCHC 31.1 30.0 - 36.0 g/dL   RDW 15.1 11.5 - 15.5 %   Platelets 372 150 - 400 K/uL   Neutrophils Relative % 76 43 - 77 %   Neutro Abs 8.4 (H) 1.7 - 7.7 K/uL   Lymphocytes Relative 14 12 - 46 %   Lymphs Abs 1.5 0.7 - 4.0 K/uL   Monocytes Relative 7 3 - 12 %   Monocytes Absolute 0.8 0.1 - 1.0 K/uL   Eosinophils Relative 2 0 - 5 %   Eosinophils Absolute 0.2 0.0 - 0.7 K/uL   Basophils Relative 1 0 - 1 %   Basophils Absolute 0.1 0.0 - 0.1 K/uL  Comprehensive metabolic panel     Status: Abnormal   Collection Time: 01/17/15  1:35 PM  Result Value Ref Range   Sodium 134 (L) 135 - 145 mmol/L   Potassium 3.6 3.5 - 5.1 mmol/L   Chloride 99 96 - 112 mmol/L   CO2 24 19 - 32 mmol/L   Glucose, Bld 113 (H) 70 - 99 mg/dL   BUN 9 6 - 23 mg/dL   Creatinine, Ser 0.87 0.50 - 1.35 mg/dL   Calcium 9.1 8.4 - 10.5 mg/dL   Total Protein 7.5 6.0 - 8.3 g/dL   Albumin 4.0 3.5 - 5.2 g/dL   AST 16 0 - 37 U/L   ALT 10 0 - 53 U/L   Alkaline Phosphatase 121 (H) 39 - 117 U/L   Total Bilirubin  0.6 0.3 - 1.2 mg/dL   GFR calc non Af Amer 86 (L) >90 mL/min   GFR calc Af Amer >90 >90 mL/min    Comment: (NOTE) The eGFR has been calculated using the CKD EPI equation. This calculation has not been validated in all clinical situations. eGFR's persistently <90 mL/min signify possible Chronic Kidney Disease.    Anion gap 11 5 - 15  Lipase, blood     Status: None   Collection Time: 01/17/15  1:35 PM  Result Value Ref Range   Lipase 41 11 - 59 U/L  Troponin I     Status: None   Collection Time: 01/17/15  1:35 PM  Result Value Ref Range   Troponin I <0.03 <0.031 ng/mL    Comment:        NO INDICATION OF MYOCARDIAL INJURY.   I-Stat Troponin, ED (not at [facility])     Status: None   Collection Time: 01/17/15  1:49 PM  Result Value Ref Range   Troponin i, poc 0.00 0.00 - 0.08 ng/mL   Comment 3            Comment: Due to the release kinetics of cTnI, a negative result within the first hours of the onset of symptoms does not rule out myocardial infarction with certainty. If myocardial infarction is still suspected, repeat the test at appropriate intervals.   I-Stat CG4 Lactic Acid, ED     Status: None   Collection Time: 01/17/15  1:51 PM    Result Value Ref Range   Lactic Acid, Venous 0.89 0.5 - 2.0 mmol/L  Urinalysis, Routine w reflex microscopic     Status: None   Collection Time: 01/17/15  5:00 PM  Result Value Ref Range   Color, Urine YELLOW YELLOW   APPearance CLEAR CLEAR   Specific Gravity, Urine 1.006 1.005 - 1.030   pH 6.5 5.0 - 8.0   Glucose, UA NEGATIVE NEGATIVE mg/dL   Hgb urine dipstick NEGATIVE NEGATIVE   Bilirubin Urine NEGATIVE NEGATIVE   Ketones, ur NEGATIVE NEGATIVE mg/dL   Protein, ur NEGATIVE NEGATIVE mg/dL   Urobilinogen, UA 0.2 0.0 - 1.0 mg/dL   Nitrite NEGATIVE NEGATIVE   Leukocytes, UA NEGATIVE NEGATIVE    Comment: MICROSCOPIC NOT DONE ON URINES WITH NEGATIVE PROTEIN, BLOOD, LEUKOCYTES, NITRITE, OR GLUCOSE <1000 mg/dL.   Ct Abdomen Pelvis Wo Contrast  01/17/2015   CLINICAL DATA:  Abdominal pain. Abdominal distention. Left bowel movement 2 days ago. Substernal chest pain. Mild nausea.  EXAM: CT ABDOMEN AND PELVIS WITHOUT CONTRAST  TECHNIQUE: Multidetector CT imaging of the abdomen and pelvis was performed following the standard protocol without IV contrast.  COMPARISON:  CT of the chest, abdomen, and pelvis 04/23/2014  FINDINGS: Mild dependent atelectasis is present at the medial right lung base. The lungs are otherwise clear. The heart size is within normal limits. Coronary artery calcifications are evident. No significant pleural or pericardial effusion is present.  The liver and spleen are within normal limits. The stomach, duodenum, and pancreas are within normal limits. The common bile duct and gallbladder are normal. The adrenal glands are within normal limits bilaterally. A a cystic lesion at the upper pole of the right kidney is slightly increased in size since the prior exam. Left-sided cystic lesions are stable. Mild stranding is again seen about both kidneys. The ureters are within normal limits. Urinary bladder is unremarkable. The prostate gland is mildly enlarged, measuring 5.0 cm in transverse  diameter. A central calcification is present. No  focal mass lesion is evident.  Mild diverticular changes are present in the sigmoid colon without focal inflammation to suggest diverticulitis. The remainder the colon is within normal limits. The appendix is visualized and normal.  The new ventral hernia is noted. This measures 10 cm in transverse diameter. A portion of the transverse colon extends into the hernia. There are also portions of small bowel in the hernia. The small bowel proximal to this area is slightly dilated with fluid levels. The small bowel distal is decompressed. There is contrast in the colon.  No significant adenopathy or free fluid is present. Fat herniates into the cord canals bilaterally as before. There is no associated bowel in the inguinal canals.  Aneurysmal enlargement of the aorta is similar to the prior study, measuring 36 mm in maximal transverse diameter.  Multilevel degenerative changes are present within the lumbar spine. Endplate changes and Schmorl's nodes are similar to the prior exam. No acute fracture is present. No focal lytic or blastic lesions are evident.  IMPRESSION: 1. New ventral hernia. The hernia measures 10 cm in transverse diameter. There is both large and small bowel within the hernia. 2. A partial small bowel obstruction is suspected with mild proximal dilation and fluid levels. Contrast does traverse this area into the colon. 3. Mild dependent atelectasis the right lung base. 4. Stable renal cystic disease. 5. Stable prostatomegaly. 6. Mild sigmoid diverticulosis without diverticulitis. 7. Fat herniates into the inguinal canals bilaterally. 8. Similar appearance of abdominal aortic aneurysm measuring 3.6 cm maximally. Recommend followup by ultrasound in 3 years. This recommendation follows ACR consensus guidelines: White Paper of the ACR Incidental Findings Committee II on Vascular Findings. J Am Coll Radiol 2013; 66:063-016 9. Stable degenerative changes in the  lumbar spine.   Electronically Signed   By: San Morelle M.D.   On: 01/17/2015 18:21   Dg Chest Port 1 View  01/17/2015   CLINICAL DATA:  Acute chest pain.  EXAM: PORTABLE CHEST - 1 VIEW  COMPARISON:  September 23, 2014.  FINDINGS: The heart size and mediastinal contours are within normal limits. Both lungs are clear. No pneumothorax or pleural effusion is noted. The visualized skeletal structures are unremarkable.  IMPRESSION: No acute cardiopulmonary abnormality seen.   Electronically Signed   By: Marijo Conception, M.D.   On: 01/17/2015 14:29   Dg Abd Portable 1v  01/17/2015   CLINICAL DATA:  Generalized abdominal pain for several days.  EXAM: PORTABLE ABDOMEN - 1 VIEW  COMPARISON:  None.  FINDINGS: The bowel gas pattern is normal. No free air is noted on left lateral decubitus view. No radio-opaque calculi or other significant radiographic abnormality are seen.  IMPRESSION: No evidence of bowel obstruction or ileus. No pneumoperitoneum is noted.   Electronically Signed   By: Marijo Conception, M.D.   On: 01/17/2015 14:27    Review of Systems  Constitutional: Positive for malaise/fatigue.  HENT: Negative.   Respiratory: Negative.   Cardiovascular: Positive for chest pain.  Gastrointestinal: Positive for nausea, abdominal pain, diarrhea and constipation.  Genitourinary: Negative.   Musculoskeletal: Negative.   Skin: Negative.   Neurological: Positive for weakness.  Psychiatric/Behavioral: The patient is nervous/anxious.     Blood pressure 193/96, pulse 73, resp. rate 16, height 5' 5"  (1.651 m), weight 68.04 kg (150 lb), SpO2 97 %. Physical Exam  Constitutional: He is oriented to person, place, and time. He appears well-developed and well-nourished.  HENT:  Head: Normocephalic.  Eyes: Pupils are equal, round,  and reactive to light. No scleral icterus.  Neck: Normal range of motion.  Cardiovascular: Normal rate and regular rhythm.   Respiratory: Effort normal and breath sounds normal.    GI: There is no rigidity, no rebound and no guarding.    Musculoskeletal: Normal range of motion.  Neurological: He is alert and oriented to person, place, and time.  Skin: Skin is warm and dry.  Psychiatric: He has a normal mood and affect. His behavior is normal.     Assessment/Plan Incisional hernia reducible but symptomatic Doubt he is obstructed It is causing a lot of pain for him and wants it repaired.  Will admit for hydration and gentle  Bowel prep and plan on incisional hernia repair with mesh on Monday. Clear liquid diet. The risk of hernia repair include bleeding,  Infection,   Recurrence of the hernia,  Mesh use, chronic pain,  Organ injury,  Bowel injury,  Bladder injury,   nerve injury with numbness around the incision,  Death,  and worsening of preexisting  medical problems.  The alternatives to surgery have been discussed as well..  Long term expectations of both operative and non operative treatments have been discussed.   The patient agrees to proceed. Recommend smoking cessation since this will increase complication rates for him.  Leyda Vanderwerf A. 01/17/2015, 8:13 PM

## 2015-01-17 NOTE — ED Notes (Signed)
Patient transported to CT 

## 2015-01-17 NOTE — H&P (Signed)
PCP:   No primary care provider on file.   Chief Complaint:  abd pain, n/v  HPI: 69 yo male h/o htn, PUD s/p perforation and repair about 7 months ago since developed ventral hernia comes in with 4 days of worsening n/v nonbloody with generalized abd pain mainly around hernia/epigastric area.  No fevers or chills.  Had bm yesterday, but none today.  Had been bloated and his hernia is much bigger than before.  He feels better with iv dilaudid but still in pain.    Review of Systems:  Positive and negative as per HPI otherwise all other systems are negative  Past Medical History: Past Medical History  Diagnosis Date  . Broken arm   . Broken leg   . Chronic pain   . Fracture of right hip 04/24/2014   Past Surgical History  Procedure Laterality Date  . Abdominal surgery    . Intramedullary (im) nail intertrochanteric Right 04/24/2014    Procedure: INTRAMEDULLARY (IM) NAIL INTERTROCHANTRIC;  Surgeon: Rozanna Box, MD;  Location: [facility];  Service: Orthopedics;  Laterality: Right;    Medications: Prior to Admission medications   Medication Sig Start Date End Date Taking? Authorizing Provider  ALPRAZolam Duanne Moron) 1 MG tablet Take one tablet by mouth three times daily for anxiety 05/05/14  Yes Tiffany L Reed, DO  aspirin EC 325 MG tablet Take 325 mg by mouth daily.   Yes Historical Provider, MD  budesonide-formoterol (SYMBICORT) 160-4.5 MCG/ACT inhaler Inhale 2 puffs into the lungs daily as needed (wheezing and shortness of breath).   Yes Historical Provider, MD  enoxaparin (LOVENOX) 40 MG/0.4ML injection Inject 0.4 mLs (40 mg total) into the skin daily. Patient not taking: Reported on 01/17/2015 04/28/14   Lisette Abu, PA-C  methocarbamol (ROBAXIN) 500 MG tablet Take 1 tablet (500 mg total) by mouth every 6 (six) hours as needed for muscle spasms. Patient not taking: Reported on 01/17/2015 04/28/14   Lisette Abu, PA-C  OxyCODONE (OXYCONTIN) 40 mg T12A 12 hr tablet Take one tablet  by mouth every 12 hours for pain. Do not crush Patient not taking: Reported on 01/17/2015 04/29/14   Blanchie Serve, MD  OxyCODONE HCl ER (OXYCONTIN) 60 MG T12A Take one tablet by mouth twice daily as needed for pain. Hold for sedation. Do not crush Patient not taking: Reported on 01/17/2015 05/07/14   Blanchie Serve, MD  oxyCODONE-acetaminophen (PERCOCET) 10-325 MG per tablet Take one tablet by mouth every 3 hours as needed for pain 1-5/10; Take two tablets by mouth every 3 hours as needed for pain 6-10/10 Patient not taking: Reported on 01/17/2015 05/27/14   Estill Dooms, MD    Allergies:  No Known Allergies  Social History:  reports that he has been smoking Cigarettes.  He has been smoking about 0.50 packs per day. He does not have any smokeless tobacco history on file. He reports that he does not drink alcohol or use illicit drugs.  Family History: History reviewed. No pertinent family history.  Physical Exam: Filed Vitals:   01/17/15 1747 01/17/15 1800 01/17/15 1815 01/17/15 1948  BP: 188/89 191/89 173/92 193/96  Pulse: 77 74 78 73  TempSrc:      Resp: 16 10 10 16   Height:      Weight:      SpO2: 97% 96% 95% 97%   General appearance: alert, cooperative and no distress Head: Normocephalic, without obvious abnormality, atraumatic Eyes: negative Nose: Nares normal. Septum midline. Mucosa normal. No drainage or  sinus tenderness. Neck: no JVD and supple, symmetrical, trachea midline Lungs: clear to auscultation bilaterally Heart: regular rate and rhythm, S1, S2 normal, no murmur, click, rub or gallop Abdomen: soft, nd, nt large ventral hernia soft reducible pos bs Extremities: extremities normal, atraumatic, no cyanosis or edema Pulses: 2+ and symmetric Skin: Skin color, texture, turgor normal. No rashes or lesions Neurologic: Grossly normal    Labs on Admission:   Recent Labs  01/17/15 1335  NA 134*  K 3.6  CL 99  CO2 24  GLUCOSE 113*  BUN 9  CREATININE 0.87  CALCIUM  9.1    Recent Labs  01/17/15 1335  AST 16  ALT 10  ALKPHOS 121*  BILITOT 0.6  PROT 7.5  ALBUMIN 4.0    Recent Labs  01/17/15 1335  LIPASE 41    Recent Labs  01/17/15 1335  WBC 10.9*  NEUTROABS 8.4*  HGB 10.8*  HCT 34.7*  MCV 86.3  PLT 372    Recent Labs  01/17/15 1335  TROPONINI <0.03   Radiological Exams on Admission: Ct Abdomen Pelvis Wo Contrast  01/17/2015   CLINICAL DATA:  Abdominal pain. Abdominal distention. Left bowel movement 2 days ago. Substernal chest pain. Mild nausea.  EXAM: CT ABDOMEN AND PELVIS WITHOUT CONTRAST  TECHNIQUE: Multidetector CT imaging of the abdomen and pelvis was performed following the standard protocol without IV contrast.  COMPARISON:  CT of the chest, abdomen, and pelvis 04/23/2014  FINDINGS: Mild dependent atelectasis is present at the medial right lung base. The lungs are otherwise clear. The heart size is within normal limits. Coronary artery calcifications are evident. No significant pleural or pericardial effusion is present.  The liver and spleen are within normal limits. The stomach, duodenum, and pancreas are within normal limits. The common bile duct and gallbladder are normal. The adrenal glands are within normal limits bilaterally. A a cystic lesion at the upper pole of the right kidney is slightly increased in size since the prior exam. Left-sided cystic lesions are stable. Mild stranding is again seen about both kidneys. The ureters are within normal limits. Urinary bladder is unremarkable. The prostate gland is mildly enlarged, measuring 5.0 cm in transverse diameter. A central calcification is present. No focal mass lesion is evident.  Mild diverticular changes are present in the sigmoid colon without focal inflammation to suggest diverticulitis. The remainder the colon is within normal limits. The appendix is visualized and normal.  The new ventral hernia is noted. This measures 10 cm in transverse diameter. A portion of the  transverse colon extends into the hernia. There are also portions of small bowel in the hernia. The small bowel proximal to this area is slightly dilated with fluid levels. The small bowel distal is decompressed. There is contrast in the colon.  No significant adenopathy or free fluid is present. Fat herniates into the cord canals bilaterally as before. There is no associated bowel in the inguinal canals.  Aneurysmal enlargement of the aorta is similar to the prior study, measuring 36 mm in maximal transverse diameter.  Multilevel degenerative changes are present within the lumbar spine. Endplate changes and Schmorl's nodes are similar to the prior exam. No acute fracture is present. No focal lytic or blastic lesions are evident.  IMPRESSION: 1. New ventral hernia. The hernia measures 10 cm in transverse diameter. There is both large and small bowel within the hernia. 2. A partial small bowel obstruction is suspected with mild proximal dilation and fluid levels. Contrast does traverse this  area into the colon. 3. Mild dependent atelectasis the right lung base. 4. Stable renal cystic disease. 5. Stable prostatomegaly. 6. Mild sigmoid diverticulosis without diverticulitis. 7. Fat herniates into the inguinal canals bilaterally. 8. Similar appearance of abdominal aortic aneurysm measuring 3.6 cm maximally. Recommend followup by ultrasound in 3 years. This recommendation follows ACR consensus guidelines: White Paper of the ACR Incidental Findings Committee II on Vascular Findings. J Am Coll Radiol 2013; 84:166-063 9. Stable degenerative changes in the lumbar spine.   Electronically Signed   By: San Morelle M.D.   On: 01/17/2015 18:21   Dg Chest Port 1 View  01/17/2015   CLINICAL DATA:  Acute chest pain.  EXAM: PORTABLE CHEST - 1 VIEW  COMPARISON:  September 23, 2014.  FINDINGS: The heart size and mediastinal contours are within normal limits. Both lungs are clear. No pneumothorax or pleural effusion is noted.  The visualized skeletal structures are unremarkable.  IMPRESSION: No acute cardiopulmonary abnormality seen.   Electronically Signed   By: Marijo Conception, M.D.   On: 01/17/2015 14:29   Dg Abd Portable 1v  01/17/2015   CLINICAL DATA:  Generalized abdominal pain for several days.  EXAM: PORTABLE ABDOMEN - 1 VIEW  COMPARISON:  None.  FINDINGS: The bowel gas pattern is normal. No free air is noted on left lateral decubitus view. No radio-opaque calculi or other significant radiographic abnormality are seen.  IMPRESSION: No evidence of bowel obstruction or ileus. No pneumoperitoneum is noted.   Electronically Signed   By: Marijo Conception, M.D.   On: 01/17/2015 14:27    Assessment/Plan  69 yo male with psbo likely from hernia  Principal Problem:   Partial small bowel obstruction-  Conservative treatment.  No ngt at this time.  Prn zofran and dilaudid.  Bowel rest.  General surgery has been consulted.  abd exam is benign at this time. ivf  Active Problems:  Stable unless o/w noted   OA (osteoarthritis)   Anxiety disorder   Drug tolerance   Essential hypertension, benign-  Prn hydralazine ordered   Abdominal pain, acute, epigastric   Nausea & vomiting   Ventral hernia- per general surgery   PUD (peptic ulcer disease)-  S/p repair about 7 months ago at [facility]?   AAA (abdominal aortic aneurysm) without rupture- pt unaware of this issue, will need outpt f/u.  Admit to med surg.  Full code.  DAVID,RACHAL A 01/17/2015, 8:23 PM

## 2015-01-17 NOTE — ED Notes (Signed)
Pt also reports that he has had loss of appetite and has not eaten much the past few days along with weakness and dizziness. POC CBG ordered. CBG WNL- 112

## 2015-01-17 NOTE — ED Provider Notes (Signed)
CSN: [id number]     Arrival date & time 01/17/15  1322 History   First MD Initiated Contact with Patient 01/17/15 1324     Chief Complaint  Patient presents with  . Abdominal Pain    abdominal surgery 6 months  . Chest Pain    substernal     HPI  Patient presents with concern of ongoing epigastric pain, sore, severe. Onset was 2 days ago.  Last bowel movement was 2 days ago. Patient also claims of chest pain, but indicates the pain is in his epigastrium, and he denies any dyspnea, cough. He denies fevers or chills. No vomiting. There is mild nausea. Patient has a very notable history of surgery 6 months ago for perforated peptic ulcer. Surgery was done in [place]. Patient was generally well until a few days ago.   Past Medical History  Diagnosis Date  . Broken arm   . Broken leg   . Chronic pain   . Fracture of right hip 04/24/2014   Past Surgical History  Procedure Laterality Date  . Abdominal surgery    . Intramedullary (im) nail intertrochanteric Right 04/24/2014    Procedure: INTRAMEDULLARY (IM) NAIL INTERTROCHANTRIC;  Surgeon: Rozanna Box, MD;  Location: [facility];  Service: Orthopedics;  Laterality: Right;   History reviewed. No pertinent family history. History  Substance Use Topics  . Smoking status: Current Every Day Smoker -- 0.50 packs/day    Types: Cigarettes  . Smokeless tobacco: Not on file  . Alcohol Use: No    Review of Systems  Constitutional:       Per HPI, otherwise negative  HENT:       Per HPI, otherwise negative  Respiratory:       Per HPI, otherwise negative  Cardiovascular:       Per HPI, otherwise negative  Gastrointestinal: Positive for nausea. Negative for vomiting.  Endocrine:       Negative aside from HPI  Genitourinary:       Neg aside from HPI   Musculoskeletal:       Per HPI, otherwise negative  Skin: Negative.   Neurological: Positive for weakness. Negative for syncope.      Allergies  Review of patient's  allergies indicates no known allergies.  Home Medications   Prior to Admission medications   Medication Sig Start Date End Date Taking? Authorizing Provider  ALPRAZolam Duanne Moron) 1 MG tablet Take one tablet by mouth three times daily for anxiety 05/05/14  Yes Tiffany L Reed, DO  aspirin EC 325 MG tablet Take 325 mg by mouth daily.   Yes Historical Provider, MD  budesonide-formoterol (SYMBICORT) 160-4.5 MCG/ACT inhaler Inhale 2 puffs into the lungs daily as needed (wheezing and shortness of breath).   Yes Historical Provider, MD  enoxaparin (LOVENOX) 40 MG/0.4ML injection Inject 0.4 mLs (40 mg total) into the skin daily. Patient not taking: Reported on 01/17/2015 04/28/14   Lisette Abu, PA-C  methocarbamol (ROBAXIN) 500 MG tablet Take 1 tablet (500 mg total) by mouth every 6 (six) hours as needed for muscle spasms. Patient not taking: Reported on 01/17/2015 04/28/14   Lisette Abu, PA-C  OxyCODONE (OXYCONTIN) 40 mg T12A 12 hr tablet Take one tablet by mouth every 12 hours for pain. Do not crush Patient not taking: Reported on 01/17/2015 04/29/14   Blanchie Serve, MD  OxyCODONE HCl ER (OXYCONTIN) 60 MG T12A Take one tablet by mouth twice daily as needed for pain. Hold for sedation. Do not crush Patient not  taking: Reported on 01/17/2015 05/07/14   Blanchie Serve, MD  oxyCODONE-acetaminophen (PERCOCET) 10-325 MG per tablet Take one tablet by mouth every 3 hours as needed for pain 1-5/10; Take two tablets by mouth every 3 hours as needed for pain 6-10/10 Patient not taking: Reported on 01/17/2015 05/27/14   Estill Dooms, MD   BP 158/85 mmHg  Pulse 80  Resp 10  Ht 5\' 5"  (1.651 m)  Wt 150 lb (68.04 kg)  BMI 24.96 kg/m2  SpO2 98% Physical Exam  Constitutional: He is oriented to person, place, and time. He appears well-developed. No distress.  HENT:  Head: Normocephalic and atraumatic.  Eyes: Conjunctivae and EOM are normal.  Cardiovascular: Normal rate and regular rhythm.   Pulmonary/Chest: Effort  normal. No stridor. No respiratory distress.  Abdominal: He exhibits no distension. There is generalized tenderness. There is guarding. There is no rigidity and no rebound.    Musculoskeletal: He exhibits no edema.  Neurological: He is alert and oriented to person, place, and time.  Skin: Skin is warm and dry.  Psychiatric: He has a normal mood and affect.  Nursing note and vitals reviewed.   ED Course  Procedures (including critical care time) Labs Review Labs Reviewed  CBC WITH DIFFERENTIAL/PLATELET - Abnormal; Notable for the following:    WBC 10.9 (*)    RBC 4.02 (*)    Hemoglobin 10.8 (*)    HCT 34.7 (*)    Neutro Abs 8.4 (*)    All other components within normal limits  COMPREHENSIVE METABOLIC PANEL - Abnormal; Notable for the following:    Sodium 134 (*)    Glucose, Bld 113 (*)    Alkaline Phosphatase 121 (*)    GFR calc non Af Amer 86 (*)    All other components within normal limits  CBG MONITORING, ED - Abnormal; Notable for the following:    Glucose-Capillary 118 (*)    All other components within normal limits  LIPASE, BLOOD  TROPONIN I  URINALYSIS, ROUTINE W REFLEX MICROSCOPIC  I-STAT CG4 LACTIC ACID, ED  Randolm Idol, ED  I-STAT CG4 LACTIC ACID, ED    Imaging Review Dg Chest Port 1 View  01/17/2015   CLINICAL DATA:  Acute chest pain.  EXAM: PORTABLE CHEST - 1 VIEW  COMPARISON:  September 23, 2014.  FINDINGS: The heart size and mediastinal contours are within normal limits. Both lungs are clear. No pneumothorax or pleural effusion is noted. The visualized skeletal structures are unremarkable.  IMPRESSION: No acute cardiopulmonary abnormality seen.   Electronically Signed   By: Marijo Conception, M.D.   On: 01/17/2015 14:29   Dg Abd Portable 1v  01/17/2015   CLINICAL DATA:  Generalized abdominal pain for several days.  EXAM: PORTABLE ABDOMEN - 1 VIEW  COMPARISON:  None.  FINDINGS: The bowel gas pattern is normal. No free air is noted on left lateral decubitus  view. No radio-opaque calculi or other significant radiographic abnormality are seen.  IMPRESSION: No evidence of bowel obstruction or ileus. No pneumoperitoneum is noted.   Electronically Signed   By: Marijo Conception, M.D.   On: 01/17/2015 14:27     EKG Interpretation   Date/Time:  Saturday January 17 2015 13:28:52 EDT Ventricular Rate:  92 PR Interval:  150 QRS Duration: 93 QT Interval:  388 QTC Calculation: 480 R Axis:   -58 Text Interpretation:  Sinus rhythm Atrial premature complex Probable left  atrial enlargement LAD, consider left anterior fascicular block Minimal ST  depression,  inferior leads Borderline prolonged QT interval Sinus rhythm  Artifact Premature atrial complexes Abnormal ekg Confirmed by Carmin Muskrat  MD [phone]) on 01/17/2015 2:11:58 PM     EMERGENCY DEPARTMENT [place] ABD/AORTA EXAM Study: Limited Ultrasound of the Abdominal Aorta.  INDICATIONS:Pulsatile abdominal mass, Abdominal pain and Age>55 Indication: Multiple views of the abdominal aorta are obtained from the diaphragmatic hiatus to the aortic bifurcation in transverse and sagittal planes with a multi- Frequency probe.  PERFORMED BY: Myself  IMAGES ARCHIVED?: No  FINDINGS: Free fluid absent  LIMITATIONS:  Body habitus, Bowel gas and Abdominal pain  INTERPRETATION:  Abdominal free fluid absent  COMMENT:  WIth surgical scar and bowel gas, several views of the aorta were obtained, w no  E/o aneurism, and no free fluid. Complete visualization not possible for the aforementioned complications.   MDM  Patient presents with increasing abdominal pain about an area that required prior surgical repair due to a perforated ulcer. Here the patient is extremely uncomfortable on arrival, with palpable sensation about a gross supraumbilical hernia. Bedside ultrasound did not demonstrate free air or free fluid. Patient had persistent hypertension, and persistent pain in spite of receiving multiple  narcotics. However, the patient's initial labs were largely reassuring. On sign out the patient had a CT scan pending.  On chart review, patient's CT scan demonstrated the grossly visible ventral hernia, and concern for possible bowel obstruction. No notable change in his aneurysm. Patient was admitted for further evaluation, management, likely repair of his hernia.     Carmin Muskrat, MD 01/18/15 [phone]

## 2015-01-17 NOTE — ED Notes (Signed)
CT informed pt done with contrast  

## 2015-01-18 ENCOUNTER — Encounter (HOSPITAL_COMMUNITY): Payer: Self-pay | Admitting: *Deleted

## 2015-01-18 DIAGNOSIS — J438 Other emphysema: Secondary | ICD-10-CM

## 2015-01-18 DIAGNOSIS — I10 Essential (primary) hypertension: Secondary | ICD-10-CM

## 2015-01-18 DIAGNOSIS — F419 Anxiety disorder, unspecified: Secondary | ICD-10-CM

## 2015-01-18 LAB — BASIC METABOLIC PANEL
Anion gap: 10 (ref 5–15)
BUN: 7 mg/dL (ref 6–23)
CHLORIDE: 101 mmol/L (ref 96–112)
CO2: 21 mmol/L (ref 19–32)
Calcium: 8.3 mg/dL — ABNORMAL LOW (ref 8.4–10.5)
Creatinine, Ser: 0.74 mg/dL (ref 0.50–1.35)
GFR calc Af Amer: >90 mL/min (ref >90)
GLUCOSE: 107 mg/dL — AB (ref 70–99)
Potassium: 3.7 mmol/L (ref 3.5–5.1)
Sodium: 132 mmol/L — ABNORMAL LOW (ref 135–145)

## 2015-01-18 LAB — PROTIME-INR
INR: 1.01 (ref 0.00–1.49)
Prothrombin Time: 13.4 seconds (ref 11.6–15.2)

## 2015-01-18 LAB — CBC
HCT: 33.3 % — ABNORMAL LOW (ref 39.0–52.0)
HEMOGLOBIN: 10.2 g/dL — AB (ref 13.0–17.0)
MCH: 26.5 pg (ref 26.0–34.0)
MCHC: 30.6 g/dL (ref 30.0–36.0)
MCV: 86.5 fL (ref 78.0–100.0)
Platelets: 347 10*3/uL (ref 150–400)
RBC: 3.85 MIL/uL — ABNORMAL LOW (ref 4.22–5.81)
RDW: 15.1 % (ref 11.5–15.5)
WBC: 9.4 10*3/uL (ref 4.0–10.5)

## 2015-01-18 LAB — SURGICAL PCR SCREEN
MRSA, PCR: NEGATIVE
Staphylococcus aureus: POSITIVE — AB

## 2015-01-18 MED ORDER — ALBUTEROL SULFATE (2.5 MG/3ML) 0.083% IN NEBU
2.5000 mg | INHALATION_SOLUTION | Freq: Four times a day (QID) | RESPIRATORY_TRACT | Status: DC | PRN
  Administered 2015-01-20: 2.5 mg via RESPIRATORY_TRACT
  Filled 2015-01-18: qty 3

## 2015-01-18 MED ORDER — PANTOPRAZOLE SODIUM 40 MG PO TBEC
40.0000 mg | DELAYED_RELEASE_TABLET | Freq: Every day | ORAL | Status: DC
  Administered 2015-01-20 – 2015-01-23 (×4): 40 mg via ORAL
  Filled 2015-01-18 (×4): qty 1

## 2015-01-18 MED ORDER — CARVEDILOL 12.5 MG PO TABS
12.5000 mg | ORAL_TABLET | Freq: Two times a day (BID) | ORAL | Status: DC
  Administered 2015-01-18 – 2015-01-24 (×12): 12.5 mg via ORAL
  Filled 2015-01-18 (×13): qty 1

## 2015-01-18 MED ORDER — NICOTINE 14 MG/24HR TD PT24
14.0000 mg | MEDICATED_PATCH | Freq: Every day | TRANSDERMAL | Status: DC
  Administered 2015-01-18 – 2015-01-23 (×5): 14 mg via TRANSDERMAL
  Filled 2015-01-18 (×5): qty 1

## 2015-01-18 MED ORDER — ALPRAZOLAM 0.5 MG PO TABS
1.0000 mg | ORAL_TABLET | Freq: Three times a day (TID) | ORAL | Status: DC | PRN
  Administered 2015-01-18 – 2015-01-21 (×5): 1 mg via ORAL
  Filled 2015-01-18 (×5): qty 2

## 2015-01-18 MED ORDER — BUDESONIDE 0.5 MG/2ML IN SUSP
0.2500 mg | Freq: Two times a day (BID) | RESPIRATORY_TRACT | Status: DC
  Administered 2015-01-19 – 2015-01-22 (×4): 0.25 mg via RESPIRATORY_TRACT
  Administered 2015-01-23: 0.5 mg via RESPIRATORY_TRACT
  Filled 2015-01-18 (×14): qty 2

## 2015-01-18 NOTE — Progress Notes (Signed)
[facility] PROGRESS NOTE  Russell Mendoza JJK:[id number] DOB: [birth year] DOA: 01/17/2015 PCP: No primary care provider on file.  Assessment/Plan: 1-partial small bowel obstruction: essentially resolved -feeling better and tolerating CLD -it was presumed to be secondary in part to ventral hernia -per [facility] rec's will proceed with ventral hernia repair on 4/11 -patient mild-moderate risk for surgery given 69, tobacco abuse, HTN and hx of COPD -recommend light use of narcotics and early use of IS/flutter valve  2-HTN:uncontrolled -will start coreg BID and continue PRN hydralazine  3-anxiety: will continue PRN anxiolytics  4-tobacco abuse: cessation counseling provided -started on nicotine patch  5-COPD: advise to quit smoking -will continue PRN albuterol and symbicort  6-GERD: with hx of PUD requiring surgical repair at [facility] -will continue PPI   Code Status: Full Family Communication: no family at bedside Disposition Plan: remains inpatient. Plans are for ventral hernia repair on 4/11   Consultants:  [facility]  Procedures:  See below for x-ray reports  Ventral hernia repair: planned for 4/11  Antibiotics:  None   HPI/Subjective: Afebrile, feeling better and currently w/o N/V. Patient reports just mild discomfort in his mid abdomen  Objective: Filed Vitals:   01/18/15 1356  BP: 172/74  Pulse: 86  Temp: 98.2 F (36.8 C)  Resp: 16    Intake/Output Summary (Last 24 hours) at 01/18/15 1459 Last data filed at 01/18/15 1200  Gross per 24 hour  Intake    699 ml  Output      1 ml  Net    698 ml   Filed Weights   01/17/15 1326 01/17/15 2058 01/18/15 0639  Weight: 68.04 kg (150 lb) 71.2 kg (156 lb 15.5 oz) 71.9 kg (158 lb 8.2 oz)    Exam:   General:  Afebrile, no nausea, no vomiting and just with mild abd discomfort. No CP and no SOB.  Cardiovascular: S1 and S2, no rubs or gallops; no murmurs   Respiratory: CTA bilaterally  Abdomen: soft, mild tenderness  in mid abd with palpation; appreciated ventral hernia (reducible); positive BS  Musculoskeletal: no edema, no cyanosis  Data Reviewed: Basic Metabolic Panel:  Recent Labs Lab 01/17/15 1335 01/18/15 0953  NA 134* 132*  K 3.6 3.7  CL 99 101  CO2 24 21  GLUCOSE 113* 107*  BUN 9 7  CREATININE 0.87 0.74  CALCIUM 9.1 8.3*   Liver Function Tests:  Recent Labs Lab 01/17/15 1335  AST 16  ALT 10  ALKPHOS 121*  BILITOT 0.6  PROT 7.5  ALBUMIN 4.0    Recent Labs Lab 01/17/15 1335  LIPASE 41   CBC:  Recent Labs Lab 01/17/15 1335 01/18/15 0953  WBC 10.9* 9.4  NEUTROABS 8.4*  --   HGB 10.8* 10.2*  HCT 34.7* 33.3*  MCV 86.3 86.5  PLT 372 347   Cardiac Enzymes:  Recent Labs Lab 01/17/15 1335  TROPONINI <0.03   CBG:  Recent Labs Lab 01/17/15 1334  GLUCAP 118*    Recent Results (from the past 240 hour(s))  Surgical pcr screen     Status: Abnormal   Collection Time: 01/18/15 12:11 AM  Result Value Ref Range Status   MRSA, PCR NEGATIVE NEGATIVE Final   Staphylococcus aureus POSITIVE (A) NEGATIVE Final    Comment:        The Xpert SA Assay (FDA approved for NASAL specimens in patients over 69 years of age), is one component of a comprehensive surveillance program.  Test performance has been validated by [facility] for patients  greater than or equal to 69 year old. It is not intended to diagnose infection nor to guide or monitor treatment.      Studies: Ct Abdomen Pelvis Wo Contrast  01/17/2015   CLINICAL DATA:  Abdominal pain. Abdominal distention. Left bowel movement 2 days ago. Substernal chest pain. Mild nausea.  EXAM: CT ABDOMEN AND PELVIS WITHOUT CONTRAST  TECHNIQUE: Multidetector CT imaging of the abdomen and pelvis was performed following the standard protocol without IV contrast.  COMPARISON:  CT of the chest, abdomen, and pelvis 04/23/2014  FINDINGS: Mild dependent atelectasis is present at the medial right lung base. The lungs are otherwise  clear. The heart size is within normal limits. Coronary artery calcifications are evident. No significant pleural or pericardial effusion is present.  The liver and spleen are within normal limits. The stomach, duodenum, and pancreas are within normal limits. The common bile duct and gallbladder are normal. The adrenal glands are within normal limits bilaterally. A a cystic lesion at the upper pole of the right kidney is slightly increased in size since the prior exam. Left-sided cystic lesions are stable. Mild stranding is again seen about both kidneys. The ureters are within normal limits. Urinary bladder is unremarkable. The prostate gland is mildly enlarged, measuring 5.0 cm in transverse diameter. A central calcification is present. No focal mass lesion is evident.  Mild diverticular changes are present in the sigmoid colon without focal inflammation to suggest diverticulitis. The remainder the colon is within normal limits. The appendix is visualized and normal.  The new ventral hernia is noted. This measures 10 cm in transverse diameter. A portion of the transverse colon extends into the hernia. There are also portions of small bowel in the hernia. The small bowel proximal to this area is slightly dilated with fluid levels. The small bowel distal is decompressed. There is contrast in the colon.  No significant adenopathy or free fluid is present. Fat herniates into the cord canals bilaterally as before. There is no associated bowel in the inguinal canals.  Aneurysmal enlargement of the aorta is similar to the prior study, measuring 36 mm in maximal transverse diameter.  Multilevel degenerative changes are present within the lumbar spine. Endplate changes and Schmorl's nodes are similar to the prior exam. No acute fracture is present. No focal lytic or blastic lesions are evident.  IMPRESSION: 1. New ventral hernia. The hernia measures 10 cm in transverse diameter. There is both large and small bowel within  the hernia. 2. A partial small bowel obstruction is suspected with mild proximal dilation and fluid levels. Contrast does traverse this area into the colon. 3. Mild dependent atelectasis the right lung base. 4. Stable renal cystic disease. 5. Stable prostatomegaly. 6. Mild sigmoid diverticulosis without diverticulitis. 7. Fat herniates into the inguinal canals bilaterally. 8. Similar appearance of abdominal aortic aneurysm measuring 3.6 cm maximally. Recommend followup by ultrasound in 3 years. This recommendation follows ACR consensus guidelines: White Paper of the ACR Incidental Findings Committee II on Vascular Findings. J Am Coll Radiol 2013; 78:295-621 9. Stable degenerative changes in the lumbar spine.   Electronically Signed   By: San Morelle M.D.   On: 01/17/2015 18:21   Dg Chest Port 1 View  01/17/2015   CLINICAL DATA:  Acute chest pain.  EXAM: PORTABLE CHEST - 1 VIEW  COMPARISON:  September 23, 2014.  FINDINGS: The heart size and mediastinal contours are within normal limits. Both lungs are clear. No pneumothorax or pleural effusion is noted. The visualized  skeletal structures are unremarkable.  IMPRESSION: No acute cardiopulmonary abnormality seen.   Electronically Signed   By: Marijo Conception, M.D.   On: 01/17/2015 14:29   Dg Abd Portable 1v  01/17/2015   CLINICAL DATA:  Generalized abdominal pain for several days.  EXAM: PORTABLE ABDOMEN - 1 VIEW  COMPARISON:  None.  FINDINGS: The bowel gas pattern is normal. No free air is noted on left lateral decubitus view. No radio-opaque calculi or other significant radiographic abnormality are seen.  IMPRESSION: No evidence of bowel obstruction or ileus. No pneumoperitoneum is noted.   Electronically Signed   By: Marijo Conception, M.D.   On: 01/17/2015 14:27    Scheduled Meds: . budesonide (PULMICORT) nebulizer solution  0.25 mg Nebulization BID  . carvedilol  12.5 mg Oral BID WC  .  ceFAZolin (ANCEF) IV  2 g Intravenous On Call to OR  .  enoxaparin (LOVENOX) injection  40 mg Subcutaneous Q24H  . polyethylene glycol  17 g Oral BID  . sodium phosphate  1 enema Rectal Once   Continuous Infusions: . sodium chloride 75 mL/hr at 01/18/15 1020    Principal Problem:   Partial small bowel obstruction Active Problems:   OA (osteoarthritis)   Anxiety disorder   Drug tolerance   Essential hypertension, benign   Abdominal pain, acute, epigastric   Nausea & vomiting   Ventral hernia   PUD (peptic ulcer disease)   AAA (abdominal aortic aneurysm) without rupture    Time spent: 30 minutes    Russell Mendoza  [facility] Pager [phone]. If 7PM-7AM, please contact night-coverage at [email or web address], password [facility] 01/18/2015, 2:59 PM  LOS: 1 day

## 2015-01-18 NOTE — Progress Notes (Signed)
Utilization review completed.  

## 2015-01-18 NOTE — Progress Notes (Signed)
Patient ID: Russell Mendoza, male   DOB: [birth year], 68 y.o.   MRN: [id number]    Subjective: Feels better with bowel rest. Denies pain, nausea or other complaints  Objective: Vital signs in last 24 hours: Temp:  [98.4 F (36.9 C)-98.8 F (37.1 C)] 98.4 F (36.9 C) (04/10 0639) Pulse Rate:  [70-94] 80 (04/10 0639) Resp:  [7-23] 14 (04/10 0639) BP: (153-212)/(77-118) 167/77 mmHg (04/10 0639) SpO2:  [94 %-99 %] 97 % (04/10 0639) Weight:  [68.04 kg (150 lb)-71.9 kg (158 lb 8.2 oz)] 71.9 kg (158 lb 8.2 oz) (04/10 0639) Last BM Date: 01/15/15  Intake/Output from previous day: 04/09 0701 - 04/10 0700 In: 699 [I.V.:699] Out: 1 [Urine:1] Intake/Output this shift:    General appearance: alert, cooperative and no distress GI: normal findings: soft, non-tender and upper abdominal soft reducible ventral incisional hernia  Lab Results:   Recent Labs  01/17/15 1335  WBC 10.9*  HGB 10.8*  HCT 34.7*  PLT 372   BMET  Recent Labs  01/17/15 1335  NA 134*  K 3.6  CL 99  CO2 24  GLUCOSE 113*  BUN 9  CREATININE 0.87  CALCIUM 9.1     Studies/Results: Ct Abdomen Pelvis Wo Contrast  01/17/2015   CLINICAL DATA:  Abdominal pain. Abdominal distention. Left bowel movement 2 days ago. Substernal chest pain. Mild nausea.  EXAM: CT ABDOMEN AND PELVIS WITHOUT CONTRAST  TECHNIQUE: Multidetector CT imaging of the abdomen and pelvis was performed following the standard protocol without IV contrast.  COMPARISON:  CT of the chest, abdomen, and pelvis 04/23/2014  FINDINGS: Mild dependent atelectasis is present at the medial right lung base. The lungs are otherwise clear. The heart size is within normal limits. Coronary artery calcifications are evident. No significant pleural or pericardial effusion is present.  The liver and spleen are within normal limits. The stomach, duodenum, and pancreas are within normal limits. The common bile duct and gallbladder are normal. The adrenal glands are within normal  limits bilaterally. A a cystic lesion at the upper pole of the right kidney is slightly increased in size since the prior exam. Left-sided cystic lesions are stable. Mild stranding is again seen about both kidneys. The ureters are within normal limits. Urinary bladder is unremarkable. The prostate gland is mildly enlarged, measuring 5.0 cm in transverse diameter. A central calcification is present. No focal mass lesion is evident.  Mild diverticular changes are present in the sigmoid colon without focal inflammation to suggest diverticulitis. The remainder the colon is within normal limits. The appendix is visualized and normal.  The new ventral hernia is noted. This measures 10 cm in transverse diameter. A portion of the transverse colon extends into the hernia. There are also portions of small bowel in the hernia. The small bowel proximal to this area is slightly dilated with fluid levels. The small bowel distal is decompressed. There is contrast in the colon.  No significant adenopathy or free fluid is present. Fat herniates into the cord canals bilaterally as before. There is no associated bowel in the inguinal canals.  Aneurysmal enlargement of the aorta is similar to the prior study, measuring 36 mm in maximal transverse diameter.  Multilevel degenerative changes are present within the lumbar spine. Endplate changes and Schmorl's nodes are similar to the prior exam. No acute fracture is present. No focal lytic or blastic lesions are evident.  IMPRESSION: 1. New ventral hernia. The hernia measures 10 cm in transverse diameter. There is both large and small  bowel within the hernia. 2. A partial small bowel obstruction is suspected with mild proximal dilation and fluid levels. Contrast does traverse this area into the colon. 3. Mild dependent atelectasis the right lung base. 4. Stable renal cystic disease. 5. Stable prostatomegaly. 6. Mild sigmoid diverticulosis without diverticulitis. 7. Fat herniates into the  inguinal canals bilaterally. 8. Similar appearance of abdominal aortic aneurysm measuring 3.6 cm maximally. Recommend followup by ultrasound in 3 years. This recommendation follows ACR consensus guidelines: White Paper of the ACR Incidental Findings Committee II on Vascular Findings. J Am Coll Radiol 2013; 69:450-388 9. Stable degenerative changes in the lumbar spine.   Electronically Signed   By: San Morelle M.D.   On: 01/17/2015 18:21   Dg Chest Port 1 View  01/17/2015   CLINICAL DATA:  Acute chest pain.  EXAM: PORTABLE CHEST - 1 VIEW  COMPARISON:  September 23, 2014.  FINDINGS: The heart size and mediastinal contours are within normal limits. Both lungs are clear. No pneumothorax or pleural effusion is noted. The visualized skeletal structures are unremarkable.  IMPRESSION: No acute cardiopulmonary abnormality seen.   Electronically Signed   By: Marijo Conception, M.D.   On: 01/17/2015 14:29   Dg Abd Portable 1v  01/17/2015   CLINICAL DATA:  Generalized abdominal pain for several days.  EXAM: PORTABLE ABDOMEN - 1 VIEW  COMPARISON:  None.  FINDINGS: The bowel gas pattern is normal. No free air is noted on left lateral decubitus view. No radio-opaque calculi or other significant radiographic abnormality are seen.  IMPRESSION: No evidence of bowel obstruction or ileus. No pneumoperitoneum is noted.   Electronically Signed   By: Marijo Conception, M.D.   On: 01/17/2015 14:27    Anti-infectives: Anti-infectives    Start     Dose/Rate Route Frequency Ordered Stop   01/18/15 0600  ceFAZolin (ANCEF) IVPB 2 g/50 mL premix     2 g 100 mL/hr over 30 Minutes Intravenous On call to O.R. 01/17/15 2104 01/19/15 0559      Assessment/Plan: Ventral incisional hernia presenting with abdominal pain and probable partial small bowel obstruction. Better with bowel rest. Plan for repair tomorrow by Dr. Brantley Stage    LOS: 1 day    [place]ris[place] Cordts T 01/18/2015

## 2015-01-18 NOTE — Plan of Care (Signed)
Problem: Consults Goal: General Surgical Patient Education (See Patient Education module for education specifics) Outcome: Progressing Fleet enema; incentive spirometer

## 2015-01-19 ENCOUNTER — Inpatient Hospital Stay (HOSPITAL_COMMUNITY): Payer: Medicare Other | Admitting: Certified Registered Nurse Anesthetist

## 2015-01-19 ENCOUNTER — Encounter (HOSPITAL_COMMUNITY): Admission: EM | Disposition: A | Discharge status: Home Health | Payer: Self-pay | Source: Home / Self Care

## 2015-01-19 ENCOUNTER — Encounter (HOSPITAL_COMMUNITY): Payer: Self-pay | Admitting: Certified Registered Nurse Anesthetist

## 2015-01-19 DIAGNOSIS — Z720 Tobacco use: Secondary | ICD-10-CM

## 2015-01-19 HISTORY — PX: VENTRAL HERNIA REPAIR: SHX424

## 2015-01-19 SURGERY — REPAIR, HERNIA, VENTRAL
Anesthesia: General | Site: Abdomen

## 2015-01-19 MED ORDER — EPHEDRINE SULFATE 50 MG/ML IJ SOLN
INTRAMUSCULAR | Status: DC | PRN
  Administered 2015-01-19 (×2): 10 mg via INTRAVENOUS

## 2015-01-19 MED ORDER — CEFAZOLIN SODIUM-DEXTROSE 2-3 GM-% IV SOLR
2.0000 g | INTRAVENOUS | Status: AC
  Administered 2015-01-19: 2 g via INTRAVENOUS

## 2015-01-19 MED ORDER — FENTANYL CITRATE 0.05 MG/ML IJ SOLN
INTRAMUSCULAR | Status: AC
  Filled 2015-01-19: qty 5

## 2015-01-19 MED ORDER — LACTATED RINGERS IV SOLN
INTRAVENOUS | Status: DC | PRN
  Administered 2015-01-19 (×2): via INTRAVENOUS

## 2015-01-19 MED ORDER — HYDROMORPHONE HCL 1 MG/ML IJ SOLN
0.2500 mg | INTRAMUSCULAR | Status: DC | PRN
  Administered 2015-01-19 (×3): 0.5 mg via INTRAVENOUS

## 2015-01-19 MED ORDER — PROPOFOL 10 MG/ML IV BOLUS
INTRAVENOUS | Status: AC
  Filled 2015-01-19: qty 20

## 2015-01-19 MED ORDER — GLYCOPYRROLATE 0.2 MG/ML IJ SOLN
INTRAMUSCULAR | Status: DC | PRN
  Administered 2015-01-19: 0.6 mg via INTRAVENOUS

## 2015-01-19 MED ORDER — 0.9 % SODIUM CHLORIDE (POUR BTL) OPTIME
TOPICAL | Status: DC | PRN
  Administered 2015-01-19: 1000 mL

## 2015-01-19 MED ORDER — ROCURONIUM BROMIDE 50 MG/5ML IV SOLN
INTRAVENOUS | Status: AC
  Filled 2015-01-19: qty 1

## 2015-01-19 MED ORDER — HYDROCODONE-ACETAMINOPHEN 5-325 MG PO TABS
1.0000 | ORAL_TABLET | ORAL | Status: DC | PRN
  Administered 2015-01-20 (×3): 2 via ORAL
  Filled 2015-01-19 (×3): qty 2

## 2015-01-19 MED ORDER — FENTANYL CITRATE 0.05 MG/ML IJ SOLN
INTRAMUSCULAR | Status: DC | PRN
Start: 2015-01-19 — End: 2015-01-19
  Administered 2015-01-19 (×6): 50 ug via INTRAVENOUS
  Administered 2015-01-19: 100 ug via INTRAVENOUS

## 2015-01-19 MED ORDER — CEFAZOLIN SODIUM-DEXTROSE 2-3 GM-% IV SOLR
INTRAVENOUS | Status: AC
  Filled 2015-01-19: qty 50

## 2015-01-19 MED ORDER — LACTATED RINGERS IV SOLN
INTRAVENOUS | Status: DC
  Administered 2015-01-19: 11:00:00 via INTRAVENOUS

## 2015-01-19 MED ORDER — HYDROMORPHONE HCL 1 MG/ML IJ SOLN
INTRAMUSCULAR | Status: AC
  Filled 2015-01-19: qty 1

## 2015-01-19 MED ORDER — PROPOFOL 10 MG/ML IV BOLUS
INTRAVENOUS | Status: DC | PRN
  Administered 2015-01-19: 160 mg via INTRAVENOUS

## 2015-01-19 MED ORDER — ARTIFICIAL TEARS OP OINT
TOPICAL_OINTMENT | OPHTHALMIC | Status: DC | PRN
  Administered 2015-01-19: 1 via OPHTHALMIC

## 2015-01-19 MED ORDER — ROCURONIUM BROMIDE 100 MG/10ML IV SOLN
INTRAVENOUS | Status: DC | PRN
  Administered 2015-01-19: 10 mg via INTRAVENOUS
  Administered 2015-01-19 (×2): 5 mg via INTRAVENOUS
  Administered 2015-01-19: 40 mg via INTRAVENOUS
  Administered 2015-01-19: 5 mg via INTRAVENOUS

## 2015-01-19 MED ORDER — NEOSTIGMINE METHYLSULFATE 10 MG/10ML IV SOLN
INTRAVENOUS | Status: DC | PRN
  Administered 2015-01-19: 4 mg via INTRAVENOUS

## 2015-01-19 MED ORDER — OXYCODONE HCL 5 MG PO TABS: 5.0000 mg | ORAL_TABLET | Freq: Once | ORAL | Status: DC | PRN

## 2015-01-19 MED ORDER — HYDRALAZINE HCL 20 MG/ML IJ SOLN
INTRAMUSCULAR | Status: AC
  Filled 2015-01-19: qty 1

## 2015-01-19 MED ORDER — ARTIFICIAL TEARS OP OINT
TOPICAL_OINTMENT | OPHTHALMIC | Status: AC
  Filled 2015-01-19: qty 3.5

## 2015-01-19 MED ORDER — LIDOCAINE HCL (CARDIAC) 20 MG/ML IV SOLN
INTRAVENOUS | Status: DC | PRN
  Administered 2015-01-19: 80 mg via INTRAVENOUS

## 2015-01-19 MED ORDER — DEXTROSE-NACL 5-0.9 % IV SOLN: INTRAVENOUS | Status: DC

## 2015-01-19 MED ORDER — ONDANSETRON HCL 4 MG/2ML IJ SOLN
INTRAMUSCULAR | Status: DC | PRN
  Administered 2015-01-19: 4 mg via INTRAVENOUS

## 2015-01-19 MED ORDER — ONDANSETRON HCL 4 MG/2ML IJ SOLN
INTRAMUSCULAR | Status: AC
  Filled 2015-01-19: qty 2

## 2015-01-19 MED ORDER — PHENYLEPHRINE 40 MCG/ML (10ML) SYRINGE FOR IV PUSH (FOR BLOOD PRESSURE SUPPORT)
PREFILLED_SYRINGE | INTRAVENOUS | Status: AC
  Filled 2015-01-19: qty 10

## 2015-01-19 MED ORDER — PROMETHAZINE HCL 25 MG/ML IJ SOLN
6.2500 mg | INTRAMUSCULAR | Status: DC | PRN
  Administered 2015-01-19: 6.25 mg via INTRAVENOUS

## 2015-01-19 MED ORDER — ENOXAPARIN SODIUM 40 MG/0.4ML ~~LOC~~ SOLN
40.0000 mg | SUBCUTANEOUS | Status: DC
  Administered 2015-01-20 – 2015-01-24 (×5): 40 mg via SUBCUTANEOUS
  Filled 2015-01-19 (×5): qty 0.4

## 2015-01-19 MED ORDER — LABETALOL HCL 5 MG/ML IV SOLN
INTRAVENOUS | Status: AC
  Filled 2015-01-19: qty 4

## 2015-01-19 MED ORDER — OXYCODONE HCL 5 MG/5ML PO SOLN: 5.0000 mg | Freq: Once | ORAL | Status: DC | PRN

## 2015-01-19 MED ORDER — PHENYLEPHRINE HCL 10 MG/ML IJ SOLN
INTRAMUSCULAR | Status: DC | PRN
  Administered 2015-01-19 (×4): 80 ug via INTRAVENOUS

## 2015-01-19 MED ORDER — PROMETHAZINE HCL 25 MG/ML IJ SOLN
INTRAMUSCULAR | Status: AC
  Filled 2015-01-19: qty 1

## 2015-01-19 MED ORDER — LABETALOL HCL 5 MG/ML IV SOLN
INTRAVENOUS | Status: DC | PRN
  Administered 2015-01-19 (×2): 10 mg via INTRAVENOUS

## 2015-01-19 SURGICAL SUPPLY — 56 items
BINDER ABDOMINAL 12 ML 46-62 (SOFTGOODS) ×2 IMPLANT
BLADE SURG 11 STRL SS (BLADE) ×2 IMPLANT
BLADE SURG ROTATE 9660 (MISCELLANEOUS) ×2 IMPLANT
CANISTER SUCTION 2500CC (MISCELLANEOUS) ×3 IMPLANT
CHLORAPREP W/TINT 26ML (MISCELLANEOUS) ×3 IMPLANT
COVER SURGICAL LIGHT HANDLE (MISCELLANEOUS) ×3 IMPLANT
DEVICE TROCAR PUNCTURE CLOSURE (ENDOMECHANICALS) ×2 IMPLANT
DRAIN CHANNEL 19F RND (DRAIN) ×4 IMPLANT
DRAPE LAPAROSCOPIC ABDOMINAL (DRAPES) ×3 IMPLANT
DRAPE UTILITY XL STRL (DRAPES) ×6 IMPLANT
DRSG OPSITE POSTOP 4X10 (GAUZE/BANDAGES/DRESSINGS) ×2 IMPLANT
ELECT CAUTERY BLADE 6.4 (BLADE) ×3 IMPLANT
ELECT REM PT RETURN 9FT ADLT (ELECTROSURGICAL) ×3
ELECTRODE REM PT RTRN 9FT ADLT (ELECTROSURGICAL) ×1 IMPLANT
EVACUATOR SILICONE 100CC (DRAIN) ×4 IMPLANT
GAUZE SPONGE 4X4 12PLY STRL (GAUZE/BANDAGES/DRESSINGS) ×2 IMPLANT
GLOVE BIO SURGEON STRL SZ7 (GLOVE) ×2 IMPLANT
GLOVE BIO SURGEON STRL SZ8 (GLOVE) ×3 IMPLANT
GLOVE BIOGEL PI IND STRL 6.5 (GLOVE) IMPLANT
GLOVE BIOGEL PI IND STRL 7.0 (GLOVE) IMPLANT
GLOVE BIOGEL PI IND STRL 7.5 (GLOVE) IMPLANT
GLOVE BIOGEL PI IND STRL 8 (GLOVE) ×1 IMPLANT
GLOVE BIOGEL PI INDICATOR 6.5 (GLOVE) ×4
GLOVE BIOGEL PI INDICATOR 7.0 (GLOVE) ×2
GLOVE BIOGEL PI INDICATOR 7.5 (GLOVE) ×2
GLOVE BIOGEL PI INDICATOR 8 (GLOVE) ×2
GLOVE ECLIPSE 7.5 STRL STRAW (GLOVE) ×2 IMPLANT
GOWN STRL REUS W/ TWL LRG LVL3 (GOWN DISPOSABLE) ×3 IMPLANT
GOWN STRL REUS W/ TWL XL LVL3 (GOWN DISPOSABLE) ×1 IMPLANT
GOWN STRL REUS W/TWL LRG LVL3 (GOWN DISPOSABLE) ×6
GOWN STRL REUS W/TWL XL LVL3 (GOWN DISPOSABLE) ×3
KIT BASIN OR (CUSTOM PROCEDURE TRAY) ×3 IMPLANT
KIT ROOM TURNOVER OR (KITS) ×3 IMPLANT
LIQUID BAND (GAUZE/BANDAGES/DRESSINGS) ×3 IMPLANT
MARKER SKIN DUAL TIP RULER LAB (MISCELLANEOUS) ×2 IMPLANT
MESH SOFT 12X12IN BARD (Mesh General) ×2 IMPLANT
NS IRRIG 1000ML POUR BTL (IV SOLUTION) ×3 IMPLANT
PACK GENERAL/GYN (CUSTOM PROCEDURE TRAY) ×3 IMPLANT
PAD ARMBOARD 7.5X6 YLW CONV (MISCELLANEOUS) ×3 IMPLANT
STAPLER VISISTAT 35W (STAPLE) ×2 IMPLANT
SUT ETHILON 2 0 FS 18 (SUTURE) ×4 IMPLANT
SUT MNCRL AB 4-0 PS2 18 (SUTURE) ×1 IMPLANT
SUT NOVA 1 T20/GS 25DT (SUTURE) ×4 IMPLANT
SUT PDS AB 1 CTX 36 (SUTURE) ×4 IMPLANT
SUT PDS AB 1 TP1 54 (SUTURE) ×4 IMPLANT
SUT PDS AB 1 TP1 96 (SUTURE) IMPLANT
SUT PDS AB 2-0 CT1 27 (SUTURE) ×18 IMPLANT
SUT PROLENE 1 CT (SUTURE) IMPLANT
SUT VIC AB 3-0 54X BRD REEL (SUTURE) IMPLANT
SUT VIC AB 3-0 BRD 54 (SUTURE)
SUT VIC AB 3-0 SH 27 (SUTURE)
SUT VIC AB 3-0 SH 27XBRD (SUTURE) ×1 IMPLANT
TAPE CLOTH SURG 6X10 WHT LF (GAUZE/BANDAGES/DRESSINGS) ×2 IMPLANT
TOWEL OR 17X24 6PK STRL BLUE (TOWEL DISPOSABLE) ×1 IMPLANT
TOWEL OR 17X26 10 PK STRL BLUE (TOWEL DISPOSABLE) ×3 IMPLANT
TRAY FOLEY CATH 14FRSI W/METER (CATHETERS) ×2 IMPLANT

## 2015-01-19 NOTE — Progress Notes (Signed)
Patient ID: Russell Mendoza, male   DOB: Feb 27, 1946, 69 y.o.   MRN: [id number]     [facility]      [address] [place], [place] [address]    Phone: [phone] FAX: [phone]     Subjective: No issues.  No n/v, abd pain.    Objective:  Vital signs:  Filed Vitals:   01/18/15 0639 01/18/15 1356 01/18/15 2101 01/19/15 0523  BP: 167/77 172/74 128/63 142/71  Pulse: 80 86 78 75  Temp: 98.4 F (36.9 C) 98.2 F (36.8 C) 98.6 F (37 C) 98 F (36.7 C)  TempSrc: Oral Oral Oral Oral  Resp: 14 16 16 16   Height:      Weight: 71.9 kg (158 lb 8.2 oz)   70.2 kg (154 lb 12.2 oz)  SpO2: 97% 98% 99% 98%    Last BM Date: 01/19/15  Intake/Output   Yesterday:    This shift:    I/O last 3 completed shifts: In: 699 [I.V.:699] Out: 1 [Urine:1]      Physical Exam: General: Pt awake/alert/oriented x4 in no acute distress Abdomen: Soft.  Nondistended. Non tender. Reducible ventral hernia.  No evidence of peritonitis.  No incarcerated hernias.  Problem List:   Principal Problem:   Partial small bowel obstruction Active Problems:   OA (osteoarthritis)   Anxiety disorder   Drug tolerance   Essential hypertension, benign   Abdominal pain, acute, epigastric   Nausea & vomiting   Ventral hernia   PUD (peptic ulcer disease)   AAA (abdominal aortic aneurysm) without rupture    Results:   Labs: Results for orders placed or performed during the hospital encounter of 01/17/15 (from the past 80 hour(s))  POC CBG, ED     Status: Abnormal   Collection Time: 01/17/15  1:34 PM  Result Value Ref Range   Glucose-Capillary 118 (H) 70 - 99 mg/dL  CBC with Differential     Status: Abnormal   Collection Time: 01/17/15  1:35 PM  Result Value Ref Range   WBC 10.9 (H) 4.0 - 10.5 K/uL   RBC 4.02 (L) 4.22 - 5.81 MIL/uL   Hemoglobin 10.8 (L) 13.0 - 17.0 g/dL   HCT 34.7 (L) 39.0 - 52.0 %   MCV 86.3 78.0 - 100.0 fL   MCH 26.9 26.0 - 34.0 pg    MCHC 31.1 30.0 - 36.0 g/dL   RDW 15.1 11.5 - 15.5 %   Platelets 372 150 - 400 K/uL   Neutrophils Relative % 76 43 - 77 %   Neutro Abs 8.4 (H) 1.7 - 7.7 K/uL   Lymphocytes Relative 14 12 - 46 %   Lymphs Abs 1.5 0.7 - 4.0 K/uL   Monocytes Relative 7 3 - 12 %   Monocytes Absolute 0.8 0.1 - 1.0 K/uL   Eosinophils Relative 2 0 - 5 %   Eosinophils Absolute 0.2 0.0 - 0.7 K/uL   Basophils Relative 1 0 - 1 %   Basophils Absolute 0.1 0.0 - 0.1 K/uL  Comprehensive metabolic panel     Status: Abnormal   Collection Time: 01/17/15  1:35 PM  Result Value Ref Range   Sodium 134 (L) 135 - 145 mmol/L   Potassium 3.6 3.5 - 5.1 mmol/L   Chloride 99 96 - 112 mmol/L   CO2 24 19 - 32 mmol/L   Glucose, Bld 113 (H) 70 - 99 mg/dL   BUN 9 6 - 23 mg/dL   Creatinine, Ser 0.87  0.50 - 1.35 mg/dL   Calcium 9.1 8.4 - 10.5 mg/dL   Total Protein 7.5 6.0 - 8.3 g/dL   Albumin 4.0 3.5 - 5.2 g/dL   AST 16 0 - 37 U/L   ALT 10 0 - 53 U/L   Alkaline Phosphatase 121 (H) 39 - 117 U/L   Total Bilirubin 0.6 0.3 - 1.2 mg/dL   GFR calc non Af Amer 86 (L) >90 mL/min   GFR calc Af Amer >90 >90 mL/min    Comment: (NOTE) The eGFR has been calculated using the CKD EPI equation. This calculation has not been validated in all clinical situations. eGFR's persistently <90 mL/min signify possible Chronic Kidney Disease.    Anion gap 11 5 - 15  Lipase, blood     Status: None   Collection Time: 01/17/15  1:35 PM  Result Value Ref Range   Lipase 41 11 - 59 U/L  Troponin I     Status: None   Collection Time: 01/17/15  1:35 PM  Result Value Ref Range   Troponin I <0.03 <0.031 ng/mL    Comment:        NO INDICATION OF MYOCARDIAL INJURY.   I-Stat Troponin, ED (not at [facility])     Status: None   Collection Time: 01/17/15  1:49 PM  Result Value Ref Range   Troponin i, poc 0.00 0.00 - 0.08 ng/mL   Comment 3            Comment: Due to the release kinetics of cTnI, a negative result within the first hours of the onset of symptoms  does not rule out myocardial infarction with certainty. If myocardial infarction is still suspected, repeat the test at appropriate intervals.   I-Stat CG4 Lactic Acid, ED     Status: None   Collection Time: 01/17/15  1:51 PM  Result Value Ref Range   Lactic Acid, Venous 0.89 0.5 - 2.0 mmol/L  Urinalysis, Routine w reflex microscopic     Status: None   Collection Time: 01/17/15  5:00 PM  Result Value Ref Range   Color, Urine YELLOW YELLOW   APPearance CLEAR CLEAR   Specific Gravity, Urine 1.006 1.005 - 1.030   pH 6.5 5.0 - 8.0   Glucose, UA NEGATIVE NEGATIVE mg/dL   Hgb urine dipstick NEGATIVE NEGATIVE   Bilirubin Urine NEGATIVE NEGATIVE   Ketones, ur NEGATIVE NEGATIVE mg/dL   Protein, ur NEGATIVE NEGATIVE mg/dL   Urobilinogen, UA 0.2 0.0 - 1.0 mg/dL   Nitrite NEGATIVE NEGATIVE   Leukocytes, UA NEGATIVE NEGATIVE    Comment: MICROSCOPIC NOT DONE ON URINES WITH NEGATIVE PROTEIN, BLOOD, LEUKOCYTES, NITRITE, OR GLUCOSE <1000 mg/dL.  Surgical pcr screen     Status: Abnormal   Collection Time: 01/18/15 12:11 AM  Result Value Ref Range   MRSA, PCR NEGATIVE NEGATIVE   Staphylococcus aureus POSITIVE (A) NEGATIVE    Comment:        The Xpert SA Assay (FDA approved for NASAL specimens in patients over 89 years of age), is one component of a comprehensive surveillance program.  Test performance has been validated by [facility] for patients greater than or equal to 61 year old. It is not intended to diagnose infection nor to guide or monitor treatment.   Basic metabolic panel     Status: Abnormal   Collection Time: 01/18/15  9:53 AM  Result Value Ref Range   Sodium 132 (L) 135 - 145 mmol/L   Potassium 3.7 3.5 - 5.1 mmol/L  Chloride 101 96 - 112 mmol/L   CO2 21 19 - 32 mmol/L   Glucose, Bld 107 (H) 70 - 99 mg/dL   BUN 7 6 - 23 mg/dL   Creatinine, Ser 0.74 0.50 - 1.35 mg/dL   Calcium 8.3 (L) 8.4 - 10.5 mg/dL   GFR calc non Af Amer >90 >90 mL/min   GFR calc Af Amer >90 >90  mL/min    Comment: (NOTE) The eGFR has been calculated using the CKD EPI equation. This calculation has not been validated in all clinical situations. eGFR's persistently <90 mL/min signify possible Chronic Kidney Disease.    Anion gap 10 5 - 15  CBC     Status: Abnormal   Collection Time: 01/18/15  9:53 AM  Result Value Ref Range   WBC 9.4 4.0 - 10.5 K/uL   RBC 3.85 (L) 4.22 - 5.81 MIL/uL   Hemoglobin 10.2 (L) 13.0 - 17.0 g/dL   HCT 33.3 (L) 39.0 - 52.0 %   MCV 86.5 78.0 - 100.0 fL   MCH 26.5 26.0 - 34.0 pg   MCHC 30.6 30.0 - 36.0 g/dL   RDW 15.1 11.5 - 15.5 %   Platelets 347 150 - 400 K/uL  Protime-INR     Status: None   Collection Time: 01/18/15  9:53 AM  Result Value Ref Range   Prothrombin Time 13.4 11.6 - 15.2 seconds   INR 1.01 0.00 - 1.49    Imaging / Studies: Ct Abdomen Pelvis Wo Contrast  01/17/2015   CLINICAL DATA:  Abdominal pain. Abdominal distention. Left bowel movement 2 days ago. Substernal chest pain. Mild nausea.  EXAM: CT ABDOMEN AND PELVIS WITHOUT CONTRAST  TECHNIQUE: Multidetector CT imaging of the abdomen and pelvis was performed following the standard protocol without IV contrast.  COMPARISON:  CT of the chest, abdomen, and pelvis 04/23/2014  FINDINGS: Mild dependent atelectasis is present at the medial right lung base. The lungs are otherwise clear. The heart size is within normal limits. Coronary artery calcifications are evident. No significant pleural or pericardial effusion is present.  The liver and spleen are within normal limits. The stomach, duodenum, and pancreas are within normal limits. The common bile duct and gallbladder are normal. The adrenal glands are within normal limits bilaterally. A a cystic lesion at the upper pole of the right kidney is slightly increased in size since the prior exam. Left-sided cystic lesions are stable. Mild stranding is again seen about both kidneys. The ureters are within normal limits. Urinary bladder is unremarkable.  The prostate gland is mildly enlarged, measuring 5.0 cm in transverse diameter. A central calcification is present. No focal mass lesion is evident.  Mild diverticular changes are present in the sigmoid colon without focal inflammation to suggest diverticulitis. The remainder the colon is within normal limits. The appendix is visualized and normal.  The new ventral hernia is noted. This measures 10 cm in transverse diameter. A portion of the transverse colon extends into the hernia. There are also portions of small bowel in the hernia. The small bowel proximal to this area is slightly dilated with fluid levels. The small bowel distal is decompressed. There is contrast in the colon.  No significant adenopathy or free fluid is present. Fat herniates into the cord canals bilaterally as before. There is no associated bowel in the inguinal canals.  Aneurysmal enlargement of the aorta is similar to the prior study, measuring 36 mm in maximal transverse diameter.  Multilevel degenerative changes are present within the  lumbar spine. Endplate changes and Schmorl's nodes are similar to the prior exam. No acute fracture is present. No focal lytic or blastic lesions are evident.  IMPRESSION: 1. New ventral hernia. The hernia measures 10 cm in transverse diameter. There is both large and small bowel within the hernia. 2. A partial small bowel obstruction is suspected with mild proximal dilation and fluid levels. Contrast does traverse this area into the colon. 3. Mild dependent atelectasis the right lung base. 4. Stable renal cystic disease. 5. Stable prostatomegaly. 6. Mild sigmoid diverticulosis without diverticulitis. 7. Fat herniates into the inguinal canals bilaterally. 8. Similar appearance of abdominal aortic aneurysm measuring 3.6 cm maximally. Recommend followup by ultrasound in 3 years. This recommendation follows ACR consensus guidelines: White Paper of the ACR Incidental Findings Committee II on Vascular Findings. J  Am Coll Radiol 2013; 94:496-759 9. Stable degenerative changes in the lumbar spine.   Electronically Signed   By: San Morelle M.D.   On: 01/17/2015 18:21   Dg Chest Port 1 View  01/17/2015   CLINICAL DATA:  Acute chest pain.  EXAM: PORTABLE CHEST - 1 VIEW  COMPARISON:  September 23, 2014.  FINDINGS: The heart size and mediastinal contours are within normal limits. Both lungs are clear. No pneumothorax or pleural effusion is noted. The visualized skeletal structures are unremarkable.  IMPRESSION: No acute cardiopulmonary abnormality seen.   Electronically Signed   By: Marijo Conception, M.D.   On: 01/17/2015 14:29   Dg Abd Portable 1v  01/17/2015   CLINICAL DATA:  Generalized abdominal pain for several days.  EXAM: PORTABLE ABDOMEN - 1 VIEW  COMPARISON:  None.  FINDINGS: The bowel gas pattern is normal. No free air is noted on left lateral decubitus view. No radio-opaque calculi or other significant radiographic abnormality are seen.  IMPRESSION: No evidence of bowel obstruction or ileus. No pneumoperitoneum is noted.   Electronically Signed   By: Marijo Conception, M.D.   On: 01/17/2015 14:27    Medications / Allergies:  Scheduled Meds: . budesonide (PULMICORT) nebulizer solution  0.25 mg Nebulization BID  . carvedilol  12.5 mg Oral BID WC  . enoxaparin (LOVENOX) injection  40 mg Subcutaneous Q24H  . nicotine  14 mg Transdermal Daily  . pantoprazole  40 mg Oral Q1200  . polyethylene glycol  17 g Oral BID   Continuous Infusions: . sodium chloride 75 mL/hr at 01/18/15 1020   PRN Meds:.acetaminophen, albuterol, ALPRAZolam, alum & mag hydroxide-simeth, hydrALAZINE, HYDROmorphone (DILAUDID) injection, ondansetron **OR** ondansetron (ZOFRAN) IV  Antibiotics: Anti-infectives    Start     Dose/Rate Route Frequency Ordered Stop   01/18/15 0600  ceFAZolin (ANCEF) IVPB 2 g/50 mL premix     2 g 100 mL/hr over 30 Minutes Intravenous On call to O.R. 01/17/15 2104 01/19/15 0559         Assessment/Plan PSBO 2/2 incisional hernia -To OR today.  Consent signed, NPO, ancef on call to OR. -IVF -SCD/lovenox -add IS -add norco for post op pain IV for breakthrough.  Will minimize as recommended by medicine   Erby Pian, ANP-BC [facility] Pager [phone](7A-4:30P) For consults and floor pages call [phone](7A-4:30P)  01/19/2015 9:28 AM

## 2015-01-19 NOTE — Progress Notes (Signed)
Attempted to call report to OR. No answer, call rung over to Short Stay, placed on hold. Will attempt to call again.

## 2015-01-19 NOTE — Transfer of Care (Signed)
Immediate Anesthesia Transfer of Care Note  Patient: Russell Mendoza  Procedure(s) Performed: Procedure(s): HERNIA REPAIR VENTRAL ADULT (N/A)  Patient Location: PACU  Anesthesia Type:General  Level of Consciousness: awake, alert , oriented and patient cooperative  Airway & Oxygen Therapy: Patient Spontanous Breathing and Patient connected to face mask oxygen  Post-op Assessment: Report given to RN, Post -op Vital signs reviewed and stable and Post -op Vital signs reviewed and unstable, Anesthesiologist notified  Post vital signs: Reviewed and stable  Last Vitals:  Filed Vitals:   01/19/15 0523  BP: 142/71  Pulse: 75  Temp: 36.7 C  Resp: 16    Complications: No apparent anesthesia complications

## 2015-01-19 NOTE — Progress Notes (Signed)
[facility] PROGRESS NOTE  Kolbi Altadonna NWG:[id number] DOB: 1946-08-16 DOA: 01/17/2015 PCP: No primary care provider on file.  Assessment/Plan: 1-partial small bowel obstruction: essentially resolved -Tolerated clear liquid diet and is now nothing by mouth for surgical repair of his ventral hernia. -it was presumed to be secondary in part to ventral hernia. Patient is feeling better and denies any nausea, vomiting or significant abdominal pain. -per [facility] rec's will proceed with ventral hernia repair later today (4/11) -patient mild-moderate risk for surgery given age, tobacco abuse, HTN and hx of COPD; but overall pretty stable and no further workup indicated prior to surgery at this moment. -recommend light use of narcotics and early use of IS/flutter valve  2-HTN:uncontrolled -will continue coreg BID and continue PRN hydralazine  3-anxiety: will continue PRN anxiolytics  4-tobacco abuse: cessation counseling provided -started on nicotine patch  5-COPD: advise to quit smoking -will continue PRN albuterol and symbicort -Condition stable and patient is currently no wheezing  6-GERD: with hx of PUD requiring surgical repair at [facility] -will continue PPI  -No hematemesis or melena  Code Status: Full Family Communication: no family at bedside Disposition Plan: remains inpatient. Plans are for ventral hernia repair on 4/11   Consultants:  [facility]  Procedures:  See below for x-ray reports  Ventral hernia repair: planned for 4/11  Antibiotics:  Ancef planned to be given prophylactically for surgical repair  HPI/Subjective: Afebrile, no chest pain, no shortness of breath. Patient denies any nausea or vomiting. Reports mild discomfort in his mid abdomen around ventral hernia.  Objective: Filed Vitals:   01/19/15 1603  BP: 144/67  Pulse: 79  Temp: 98.9 F (37.2 C)  Resp: 16    Intake/Output Summary (Last 24 hours) at 01/19/15 1623 Last data filed at 01/19/15 1530   Gross per 24 hour  Intake   1000 ml  Output    361 ml  Net    639 ml   Filed Weights   01/17/15 2058 01/18/15 0639 01/19/15 0523  Weight: 71.2 kg (156 lb 15.5 oz) 71.9 kg (158 lb 8.2 oz) 70.2 kg (154 lb 12.2 oz)    Exam:   General:  Afebrile, no nausea, no vomiting. Patient is in no acute distress and main complain is chills mild discomfort for increased ventral hernia. No CP and no SOB.  Cardiovascular: S1 and S2, no rubs or gallops; no murmurs ; no JVD  Respiratory: CTA bilaterally; no use of accessory muscles appreciated on exam   Abdomen: soft, mild tenderness in mid abd with palpation; appreciated ventral hernia (reducible); positive BS; no guarding. Positive bowel sounds  Musculoskeletal: no edema, no cyanosis  Data Reviewed: Basic Metabolic Panel:  Recent Labs Lab 01/17/15 1335 01/18/15 0953  NA 134* 132*  K 3.6 3.7  CL 99 101  CO2 24 21  GLUCOSE 113* 107*  BUN 9 7  CREATININE 0.87 0.74  CALCIUM 9.1 8.3*   Liver Function Tests:  Recent Labs Lab 01/17/15 1335  AST 16  ALT 10  ALKPHOS 121*  BILITOT 0.6  PROT 7.5  ALBUMIN 4.0    Recent Labs Lab 01/17/15 1335  LIPASE 41   CBC:  Recent Labs Lab 01/17/15 1335 01/18/15 0953  WBC 10.9* 9.4  NEUTROABS 8.4*  --   HGB 10.8* 10.2*  HCT 34.7* 33.3*  MCV 86.3 86.5  PLT 372 347   Cardiac Enzymes:  Recent Labs Lab 01/17/15 1335  TROPONINI <0.03   CBG:  Recent Labs Lab 01/17/15 1334  GLUCAP 118*  Recent Results (from the past 240 hour(s))  Surgical pcr screen     Status: Abnormal   Collection Time: 01/18/15 12:11 AM  Result Value Ref Range Status   MRSA, PCR NEGATIVE NEGATIVE Final   Staphylococcus aureus POSITIVE (A) NEGATIVE Final    Comment:        The Xpert SA Assay (FDA approved for NASAL specimens in patients over 77 years of age), is one component of a comprehensive surveillance program.  Test performance has been validated by [facility] for patients greater than or  equal to 62 year old. It is not intended to diagnose infection nor to guide or monitor treatment.      Studies: Ct Abdomen Pelvis Wo Contrast  01/17/2015   CLINICAL DATA:  Abdominal pain. Abdominal distention. Left bowel movement 2 days ago. Substernal chest pain. Mild nausea.  EXAM: CT ABDOMEN AND PELVIS WITHOUT CONTRAST  TECHNIQUE: Multidetector CT imaging of the abdomen and pelvis was performed following the standard protocol without IV contrast.  COMPARISON:  CT of the chest, abdomen, and pelvis 04/23/2014  FINDINGS: Mild dependent atelectasis is present at the medial right lung base. The lungs are otherwise clear. The heart size is within normal limits. Coronary artery calcifications are evident. No significant pleural or pericardial effusion is present.  The liver and spleen are within normal limits. The stomach, duodenum, and pancreas are within normal limits. The common bile duct and gallbladder are normal. The adrenal glands are within normal limits bilaterally. A a cystic lesion at the upper pole of the right kidney is slightly increased in size since the prior exam. Left-sided cystic lesions are stable. Mild stranding is again seen about both kidneys. The ureters are within normal limits. Urinary bladder is unremarkable. The prostate gland is mildly enlarged, measuring 5.0 cm in transverse diameter. A central calcification is present. No focal mass lesion is evident.  Mild diverticular changes are present in the sigmoid colon without focal inflammation to suggest diverticulitis. The remainder the colon is within normal limits. The appendix is visualized and normal.  The new ventral hernia is noted. This measures 10 cm in transverse diameter. A portion of the transverse colon extends into the hernia. There are also portions of small bowel in the hernia. The small bowel proximal to this area is slightly dilated with fluid levels. The small bowel distal is decompressed. There is contrast in the colon.   No significant adenopathy or free fluid is present. Fat herniates into the cord canals bilaterally as before. There is no associated bowel in the inguinal canals.  Aneurysmal enlargement of the aorta is similar to the prior study, measuring 36 mm in maximal transverse diameter.  Multilevel degenerative changes are present within the lumbar spine. Endplate changes and Schmorl's nodes are similar to the prior exam. No acute fracture is present. No focal lytic or blastic lesions are evident.  IMPRESSION: 1. New ventral hernia. The hernia measures 10 cm in transverse diameter. There is both large and small bowel within the hernia. 2. A partial small bowel obstruction is suspected with mild proximal dilation and fluid levels. Contrast does traverse this area into the colon. 3. Mild dependent atelectasis the right lung base. 4. Stable renal cystic disease. 5. Stable prostatomegaly. 6. Mild sigmoid diverticulosis without diverticulitis. 7. Fat herniates into the inguinal canals bilaterally. 8. Similar appearance of abdominal aortic aneurysm measuring 3.6 cm maximally. Recommend followup by ultrasound in 3 years. This recommendation follows ACR consensus guidelines: White Paper of the ACR Incidental  Findings Committee II on Vascular Findings. J Am Coll Radiol 2013; 09:326-712 9. Stable degenerative changes in the lumbar spine.   Electronically Signed   By: San Morelle M.D.   On: 01/17/2015 18:21    Scheduled Meds: . budesonide (PULMICORT) nebulizer solution  0.25 mg Nebulization BID  . carvedilol  12.5 mg Oral BID WC  . enoxaparin (LOVENOX) injection  40 mg Subcutaneous Q24H  . hydrALAZINE      . HYDROmorphone      . HYDROmorphone      . nicotine  14 mg Transdermal Daily  . pantoprazole  40 mg Oral Q1200  . promethazine       Continuous Infusions: . dextrose 5 % and 0.9% NaCl Stopped (01/19/15 0930)  . lactated ringers 50 mL/hr at 01/19/15 1053    Principal Problem:   Partial small bowel  obstruction Active Problems:   OA (osteoarthritis)   Anxiety disorder   Drug tolerance   Essential hypertension, benign   Abdominal pain, acute, epigastric   Nausea & vomiting   Ventral hernia   PUD (peptic ulcer disease)   AAA (abdominal aortic aneurysm) without rupture    Time spent: 30 minutes    Barton Dubois  [facility] Pager [phone]. If 7PM-7AM, please contact night-coverage at [email or web address], password [facility] 01/19/2015, 4:23 PM  LOS: 2 days

## 2015-01-19 NOTE — Interval H&P Note (Signed)
History and Physical Interval Note:  01/19/2015 11:20 AM  Russell Mendoza  has presented today for surgery, with the diagnosis of Ventral Hernia  The various methods of treatment have been discussed with the patient and family. After consideration of risks, benefits and other options for treatment, the patient has consented to  Procedure(s): HERNIA REPAIR VENTRAL ADULT (N/A) as a surgical intervention .  The patient's history has been reviewed, patient examined, no change in status, stable for surgery.  I have reviewed the patient's chart and labs.  Questions were answered to the patient's satisfaction.  The risk of hernia repair include bleeding,  Infection,   Recurrence of the hernia,  Mesh use, chronic pain,  Organ injury,  Bowel injury,  Bladder injury,   nerve injury with numbness around the incision,  Death,  and worsening of preexisting  medical problems.  The alternatives to surgery have been discussed as well..  Long term expectations of both operative and non operative treatments have been discussed.   The patient agrees to proceed.   Anysa Tacey A.

## 2015-01-19 NOTE — Anesthesia Preprocedure Evaluation (Addendum)
Anesthesia Evaluation  Patient identified by MRN, date of birth, ID band Patient awake    Reviewed: Allergy & Precautions, NPO status , Patient's Chart, lab work & pertinent test results  Airway Mallampati: II  TM Distance: >3 FB Neck ROM: Full    Dental   Pulmonary Current Smoker,  breath sounds clear to auscultation        Cardiovascular hypertension, + Peripheral Vascular Disease Rhythm:Regular Rate:Normal     Neuro/Psych PSYCHIATRIC DISORDERS Anxiety negative neurological ROS     GI/Hepatic Neg liver ROS, PUD, GERD-  ,  Endo/Other  negative endocrine ROS  Renal/GU negative Renal ROS     Musculoskeletal  (+) Arthritis -,   Abdominal   Peds  Hematology  (+) anemia , Hgb 10.2   Anesthesia Other Findings   Reproductive/Obstetrics                            Anesthesia Physical Anesthesia Plan  ASA: II  Anesthesia Plan: General   Post-op Pain Management:    Induction: Intravenous  Airway Management Planned: Oral ETT and LMA  Additional Equipment:   Intra-op Plan:   Post-operative Plan: Extubation in OR  Informed Consent: I have reviewed the patients History and Physical, chart, labs and discussed the procedure including the risks, benefits and alternatives for the proposed anesthesia with the patient or authorized representative who has indicated his/her understanding and acceptance.   Dental advisory given  Plan Discussed with: CRNA  Anesthesia Plan Comments:         Anesthesia Quick Evaluation

## 2015-01-19 NOTE — Brief Op Note (Signed)
01/17/2015 - 01/19/2015  2:05 PM  PATIENT:  Russell Mendoza  69 y.o. male  PRE-OPERATIVE DIAGNOSIS:  Ventral Hernia  POST-OPERATIVE DIAGNOSIS:  Ventral Hernia  PROCEDURE:  Procedure(s): HERNIA REPAIR VENTRAL ADULT (N/A) With mesh  SURGEON:  Surgeon(s) and Role:    [name], MD - Primary    ASSISTANTS: none   ANESTHESIA:   general  EBL:  Total I/O In: 1000 [I.V.:1000] Out: 201 [Urine:201]  BLOOD ADMINISTERED:none  DRAINS: (19) Jackson-Pratt drain(s) with closed bulb suction in the submuscular and subcutaneous space   LOCAL MEDICATIONS USED:  NONE  SPECIMEN:  No Specimen  DISPOSITION OF SPECIMEN:  N/A  COUNTS:  YES  TOURNIQUET:  * No tourniquets in log *  DICTATION: .Other Dictation: Dictation Number [id number]  PLAN OF CARE: Admit to inpatient   PATIENT DISPOSITION:  PACU - hemodynamically stable.   Delay start of Pharmacological VTE agent (>24hrs) due to surgical blood loss or risk of bleeding: no

## 2015-01-19 NOTE — Anesthesia Procedure Notes (Signed)
Procedure Name: Intubation Date/Time: 01/19/2015 11:51 AM Performed by: Rogers Blocker Pre-anesthesia Checklist: Patient identified, Emergency Drugs available, Suction available, Patient being monitored and Timeout performed Patient Re-evaluated:Patient Re-evaluated prior to inductionOxygen Delivery Method: Circle system utilized Preoxygenation: Pre-oxygenation with 100% oxygen Intubation Type: IV induction Ventilation: Mask ventilation without difficulty Laryngoscope Size: Mac and 4 Grade View: Grade I Tube type: Oral Tube size: 7.5 mm Number of attempts: 1 Airway Equipment and Method: Stylet Placement Confirmation: ETT inserted through vocal cords under direct vision,  positive ETCO2,  CO2 detector and breath sounds checked- equal and bilateral Secured at: 22 cm Tube secured with: Tape Dental Injury: Teeth and Oropharynx as per pre-operative assessment

## 2015-01-20 ENCOUNTER — Encounter (HOSPITAL_COMMUNITY): Payer: Self-pay | Admitting: Surgery

## 2015-01-20 DIAGNOSIS — G894 Chronic pain syndrome: Secondary | ICD-10-CM

## 2015-01-20 DIAGNOSIS — M158 Other polyosteoarthritis: Secondary | ICD-10-CM

## 2015-01-20 LAB — CBC
HCT: 33 % — ABNORMAL LOW (ref 39.0–52.0)
Hemoglobin: 10 g/dL — ABNORMAL LOW (ref 13.0–17.0)
MCH: 26 pg (ref 26.0–34.0)
MCHC: 30.3 g/dL (ref 30.0–36.0)
MCV: 85.7 fL (ref 78.0–100.0)
Platelets: 317 10*3/uL (ref 150–400)
RBC: 3.85 MIL/uL — AB (ref 4.22–5.81)
RDW: 15 % (ref 11.5–15.5)
WBC: 10.3 10*3/uL (ref 4.0–10.5)

## 2015-01-20 LAB — BASIC METABOLIC PANEL
Anion gap: 9 (ref 5–15)
BUN: 8 mg/dL (ref 6–23)
CALCIUM: 8.2 mg/dL — AB (ref 8.4–10.5)
CO2: 25 mmol/L (ref 19–32)
Chloride: 102 mmol/L (ref 96–112)
Creatinine, Ser: 0.8 mg/dL (ref 0.50–1.35)
GFR, EST NON AFRICAN AMERICAN: 89 mL/min — AB (ref >90)
GLUCOSE: 111 mg/dL — AB (ref 70–99)
POTASSIUM: 3.7 mmol/L (ref 3.5–5.1)
SODIUM: 136 mmol/L (ref 135–145)

## 2015-01-20 MED ORDER — OXYCODONE HCL ER 40 MG PO T12A: 40.0000 mg | EXTENDED_RELEASE_TABLET | Freq: Two times a day (BID) | ORAL | Status: DC

## 2015-01-20 MED ORDER — OXYCODONE-ACETAMINOPHEN 5-325 MG PO TABS
1.0000 | ORAL_TABLET | ORAL | Status: DC | PRN
  Administered 2015-01-20 – 2015-01-24 (×12): 2 via ORAL
  Filled 2015-01-20 (×12): qty 2

## 2015-01-20 MED ORDER — HYDROMORPHONE HCL 1 MG/ML IJ SOLN
1.0000 mg | INTRAMUSCULAR | Status: DC | PRN
  Administered 2015-01-21: 1 mg via INTRAVENOUS
  Filled 2015-01-20 (×3): qty 1

## 2015-01-20 MED ORDER — OXYCODONE HCL ER 10 MG PO T12A
30.0000 mg | EXTENDED_RELEASE_TABLET | Freq: Three times a day (TID) | ORAL | Status: DC
  Administered 2015-01-20 – 2015-01-21 (×2): 30 mg via ORAL
  Filled 2015-01-20 (×2): qty 3

## 2015-01-20 MED ORDER — ALPRAZOLAM 0.5 MG PO TABS
1.0000 mg | ORAL_TABLET | Freq: Three times a day (TID) | ORAL | Status: DC
  Administered 2015-01-20 (×2): 1 mg via ORAL
  Filled 2015-01-20 (×2): qty 2

## 2015-01-20 MED ORDER — KETOROLAC TROMETHAMINE 15 MG/ML IJ SOLN
15.0000 mg | Freq: Three times a day (TID) | INTRAMUSCULAR | Status: AC
  Administered 2015-01-20 (×2): 15 mg via INTRAVENOUS
  Filled 2015-01-20 (×3): qty 1

## 2015-01-20 MED ORDER — ASPIRIN EC 325 MG PO TBEC
325.0000 mg | DELAYED_RELEASE_TABLET | Freq: Every day | ORAL | Status: DC
  Administered 2015-01-20 – 2015-01-24 (×5): 325 mg via ORAL
  Filled 2015-01-20 (×5): qty 1

## 2015-01-20 MED ORDER — OXYCODONE-ACETAMINOPHEN 10-325 MG PO TABS: 1.0000 | ORAL_TABLET | ORAL | Status: DC | PRN

## 2015-01-20 NOTE — Op Note (Unsigned)
NAMERONELLE, SMALLMAN NO.:  [id number]  MEDICAL RECORD NO.:  [id number]  LOCATION:  6N02C                        FACILITY:  [facility]  PHYSICIAN:  Marcello Moores A. Alencia Gordon, M.D.DATE OF BIRTH:  Jan 01, 1946  DATE OF PROCEDURE:  01/19/2015 DATE OF DISCHARGE:                              OPERATIVE REPORT   PREOPERATIVE DIAGNOSIS:  Incisional hernia.  POSTOPERATIVE DIAGNOSIS:  Incisional hernia.  PROCEDURE:  Repair of incisional hernia using a 12 cm x 12 cm large-pore polypropylene mesh with creation of myofascial advancement flaps measuring 15 x 15 cm.  SURGEON:  Marcello Moores A. Acelin Ferdig, M.D.  ANESTHESIA:  General endotracheal anesthesia.  EBL:  Approximately 50 mL.  IV FLUIDS:  1.2 L of crystalloid.  DRAINS:  Two 19 round drains, one to the submuscular space and one to the subcutaneous space.  SPECIMENS:  None.  INDICATIONS FOR PROCEDURE:  The patient is a 69 year old male admitted 2 days ago with a painful incisional hernia.  History of previous peptic ulcer disease surgery 1 year ago and this hernia has developed in the last 3 months.  He came in to the hospital 2 days ago with 1-week history of severe abdominal pain and inability to eat.  CT scan showed a large reducible incisional hernia with questionable partial obstruction. He presents today for repair.  We discussed laparoscopic and open procedures.  He has significant diastasis of his rectus muscles, therefore, laparoscopic approach was not recommended.  I have recommended an open approach with advancement of the rectus muscles back to the midline with the use of mesh.  Risk of bleeding, infection, organ injury, bowel injury, possible bowel resection, death, DVT, cardiovascular risks discussed as well as the need for other operative procedures.  He voiced understanding of the above procedure as well as the discussion of nonoperative options, which were nil.  He wished to proceed.  DESCRIPTION OF PROCEDURE:   The patient was met in the holding area. Questions answered.  He was taken back to the operating room and placed supine on the OR table.  After sterile prep and drape of the abdomen and general anesthesia, a time-out was performed.  Midline incision was used to the old upper abdominal scar.  We dissected into hernia sac, which was quite superficial.  I then opened the hernia sac to expose the entire length of the incision.  Intraabdominal adhesions were taken down with Metzenbaum to expose both rectus muscles, which had regressed from the midline by about 8 cm.  I then mobilized the posterior sheath off the rectus muscles circumferentially on both sides and gained at least 4 to 5 cm aside by doing this.  I then examined the intraabdominal viscera and saw no sign of injury.  We then closed the posterior sheath with #1 PDS.  I then used a large pore polypropylene mesh and trimmed this down to fit the undersurface of the rectus muscles with at least 5 cm of overlap in all direction.  This was secured with transfascial sutures using a suture passer circumferentially under direct vision using 2-0 PDS.  This put the mesh in a submuscular space on top of the posterior rectus sheath.  I then placed  a drain into this space once the mesh was secured and secured it with a 2-0 nylon.  We then after mobilizing the myofascial flaps were able then to close the myofascial flaps over the mesh with #1 PDS.  A second drain was placed in the subcutaneous tissues after mobilizing the skin off the rectus muscle.  Staple was used to close the skin.  We then placed a second drain that was placed in subcutaneous space to bulb suction as the first.  Secured the skin with 2-0 nylon.  Dry dressing was applied.  All final counts of sponge, needle, and instruments were found to be correct at this portion of the case.  The patient was then awoke, extubated, and taken to recovery in satisfactory  condition.     Dynasia Kercheval A. Deni Lefever, M.D.     TAC/MEDQ  D:  01/19/2015  T:  01/20/2015  Job:  [id number]

## 2015-01-20 NOTE — Progress Notes (Signed)
Patient discussed with General surgery team and given the fact his medical problems are very stable, will transfer care to CCS service to continue further treatment and post-operative care.  Plans/recommendations: -patient will need to continue on coreg BID at discharge -continue statins -PRN analgesics for chronic pain and surgical pain -PRN anxiolytics medications -cessation counseling for tobacco abuse provided -needs to establish care with PCP at discharge; CM contacted to assist with this -will recommend to have BP reassessed and medications adjusted As needed (approx 2 weeks after discharge) -please contact us with any need or help that we can provide for Russell Mendoza, Mah [phone]

## 2015-01-20 NOTE — Progress Notes (Signed)
Patient ID: Russell Mendoza, male   DOB: 04-Nov-1945, 69 y.o.   MRN: [id number]     [facility]      [address] [place], [place] [address]    Phone: [phone] FAX: [phone]     Subjective: Very sore.  Passing flatus.  Tolerated clears.  Adequate UOP.    Objective:  Vital signs:  Filed Vitals:   01/20/15 0201 01/20/15 0444 01/20/15 0500 01/20/15 0752  BP: 172/76 142/75  149/69  Pulse: 91 91  86  Temp: 98.9 F (37.2 C) 99.2 F (37.3 C)    TempSrc: Oral Oral    Resp: 19 18    Height:      Weight:   70.95 kg (156 lb 6.7 oz)   SpO2: 95% 94%      Last BM Date: 01/19/15  Intake/Output   Yesterday:  04/11 0701 - 04/12 0700 In: 1800.8 [P.O.:195; I.V.:1605.8] Out: 2936 [NLGXQ:1194; Drains:260] This shift:    I/O last 3 completed shifts: In: 1800.8 [P.O.:195; I.V.:1605.8] Out: 2936 [Urine:2676; Drains:260]    Physical Exam: General: Pt awake/alert/oriented x4 in no acute distress Abdomen: Soft.  Nondistended.  JP drains with serosanguinous output.  Incisions are c/d/i, abdominal binder. Mildly tender at incisions only.  No evidence of peritonitis.  No incarcerated hernias.    Problem List:   Principal Problem:   Partial small bowel obstruction Active Problems:   OA (osteoarthritis)   Anxiety disorder   Drug tolerance   Essential hypertension, benign   Abdominal pain, acute, epigastric   Nausea & vomiting   Ventral hernia   PUD (peptic ulcer disease)   AAA (abdominal aortic aneurysm) without rupture    Results:   Labs: Results for orders placed or performed during the hospital encounter of 01/17/15 (from the past 1 hour(s))  Basic metabolic panel     Status: Abnormal   Collection Time: 01/18/15  9:53 AM  Result Value Ref Range   Sodium 132 (L) 135 - 145 mmol/L   Potassium 3.7 3.5 - 5.1 mmol/L   Chloride 101 96 - 112 mmol/L   CO2 21 19 - 32 mmol/L   Glucose, Bld 107 (H) 70 - 99 mg/dL   BUN 7 6 - 23  mg/dL   Creatinine, Ser 0.74 0.50 - 1.35 mg/dL   Calcium 8.3 (L) 8.4 - 10.5 mg/dL   GFR calc non Af Amer >90 >90 mL/min   GFR calc Af Amer >90 >90 mL/min    Comment: (NOTE) The eGFR has been calculated using the CKD EPI equation. This calculation has not been validated in all clinical situations. eGFR's persistently <90 mL/min signify possible Chronic Kidney Disease.    Anion gap 10 5 - 15  CBC     Status: Abnormal   Collection Time: 01/18/15  9:53 AM  Result Value Ref Range   WBC 9.4 4.0 - 10.5 K/uL   RBC 3.85 (L) 4.22 - 5.81 MIL/uL   Hemoglobin 10.2 (L) 13.0 - 17.0 g/dL   HCT 33.3 (L) 39.0 - 52.0 %   MCV 86.5 78.0 - 100.0 fL   MCH 26.5 26.0 - 34.0 pg   MCHC 30.6 30.0 - 36.0 g/dL   RDW 15.1 11.5 - 15.5 %   Platelets 347 150 - 400 K/uL  Protime-INR     Status: None   Collection Time: 01/18/15  9:53 AM  Result Value Ref Range   Prothrombin Time 13.4 11.6 - 15.2 seconds   INR 1.01  0.00 - 1.49    Imaging / Studies: No results found.  Medications / Allergies:  Scheduled Meds: . budesonide (PULMICORT) nebulizer solution  0.25 mg Nebulization BID  . carvedilol  12.5 mg Oral BID WC  . enoxaparin (LOVENOX) injection  40 mg Subcutaneous Q24H  . nicotine  14 mg Transdermal Daily  . pantoprazole  40 mg Oral Q1200   Continuous Infusions: . lactated ringers 50 mL/hr at 01/19/15 1053   PRN Meds:.acetaminophen, albuterol, ALPRAZolam, alum & mag hydroxide-simeth, hydrALAZINE, HYDROcodone-acetaminophen, HYDROmorphone (DILAUDID) injection, ondansetron **OR** ondansetron (ZOFRAN) IV  Antibiotics: Anti-infectives    Start     Dose/Rate Route Frequency Ordered Stop   01/19/15 1030  ceFAZolin (ANCEF) IVPB 2 g/50 mL premix     2 g 100 mL/hr over 30 Minutes Intravenous On call to O.R. 01/19/15 0925 01/19/15 1159   01/18/15 0600  ceFAZolin (ANCEF) IVPB 2 g/50 mL premix     2 g 100 mL/hr over 30 Minutes Intravenous On call to O.R. 01/17/15 2104 01/19/15 0559         Assessment/Plan POD#1 ventral hernia repair---Dr. Darene Lamer Cornett -full liquid diet -DC IVF -will DC foley tomorrow morning -add toradol x24h -c/w norco, IV dilaudid for breakthrough pain -continue drains, pt to be discharged home with drains and f/u with Dr. Brantley Stage in 2 weeks  -PT eval, mobilize -IS -SCD/lovenox  COPD HTN Tobacco use GERD  Dispo--continue inpatient for pain management, advancing diet  Erby Pian, ANP-BC [facility] Pager [phone](7A-4:30P) For consults and floor pages call [phone](7A-4:30P)  01/20/2015  8:56 AM

## 2015-01-20 NOTE — Progress Notes (Signed)
PT Cancellation Note  Patient Details Name: Russell Mendoza MRN: [id number] DOB: 06-May-1946   Cancelled Treatment:    Reason Eval/Treat Not Completed: Pain limiting ability to participate; patient reports in too much pain to move.  RN working on pain med regimine  Will attempt PT eval tomorrow.   WYNN,CYNDI 01/20/2015, 3:14 PM

## 2015-01-21 MED ORDER — OXYCODONE HCL 5 MG PO TABS
10.0000 mg | ORAL_TABLET | Freq: Three times a day (TID) | ORAL | Status: DC
  Administered 2015-01-21 – 2015-01-24 (×10): 10 mg via ORAL
  Filled 2015-01-21 (×10): qty 2

## 2015-01-21 MED ORDER — ALPRAZOLAM 0.5 MG PO TABS
0.5000 mg | ORAL_TABLET | Freq: Three times a day (TID) | ORAL | Status: DC
  Administered 2015-01-21 – 2015-01-24 (×10): 0.5 mg via ORAL
  Filled 2015-01-21 (×10): qty 1

## 2015-01-21 NOTE — Evaluation (Signed)
Physical Therapy Evaluation Patient Details Name: Russell Mendoza MRN: [id number] DOB: Aug 11, 1946 Today's Date: 01/21/2015   History of Present Illness  Patient is a 69 y/o male s/p ventral hernia repair 4/11. PMH of chronic pain and IM nailing left hip 04/2014.  Clinical Impression  Patient presents with post surgical pain and balance deficits impacting mobility. Tolerated ambulation with much encouragement and Min guard for safety due to drifting. Education provided on bracing when coughing/sneezing etc to help with pain control in abdomen. Pt would benefit from skilled PT to improve balance, gait and overall safe mobility so pt can maximize independence and return to PLOF. Pt reports his g/f will be able to assist minimally at home.     Follow Up Recommendations No PT follow up;Supervision/Assistance - 24 hour    Equipment Recommendations  None recommended by PT    Recommendations for Other Services       Precautions / Restrictions Precautions Precautions: Fall Required Braces or Orthoses: Other Brace/Splint Other Brace/Splint: abdominal brace Restrictions Weight Bearing Restrictions: No      Mobility  Bed Mobility Overal bed mobility: Modified Independent;Needs Assistance Bed Mobility: Rolling;Sidelying to Sit;Sit to Sidelying Rolling: Supervision Sidelying to sit: Supervision;HOB elevated     Sit to sidelying: Supervision;HOB elevated General bed mobility comments: Heavy use of rails for support. Good demo of log roll technique.  Transfers Overall transfer level: Needs assistance Equipment used: None Transfers: Sit to/from Stand Sit to Stand: Min guard         General transfer comment: Min guard for safety.   Ambulation/Gait Ambulation/Gait assistance: Min guard Ambulation Distance (Feet): 150 Feet Assistive device: None Gait Pattern/deviations: Step-through pattern;Decreased stride length;Drifts right/left   Gait velocity interpretation: Below normal speed  for age/gender General Gait Details: Pt mildly unsteady during gait, drifting to right/left, no overt LOB.  Stairs            Wheelchair Mobility    Modified Rankin (Stroke Patients Only)       Balance Overall balance assessment: Needs assistance Sitting-balance support: Feet supported;No upper extremity supported Sitting balance-[name]: Good     Standing balance support: During functional activity Standing balance-[name]: Fair                               Pertinent Vitals/Pain Pain Assessment: 0-10 Pain Score: 10-Worst pain ever (20+) Pain Location: abdomen Pain Descriptors / Indicators: Sore;Aching Pain Intervention(s): Limited activity within patient's tolerance;Monitored during session;Premedicated before session;Repositioned    Home Living Family/patient expects to be discharged to:: Private residence Living Arrangements: Spouse/significant other Available Help at Discharge: Family;Available 24 hours/day Type of Home: Mobile home Home Access: Stairs to enter Entrance Stairs-Rails: Right;Left;Can reach both Entrance Stairs-Number of Steps: 3 Home Layout: One level Home Equipment: None Additional Comments: Above home setup describes pt's gf's home (where he will be going at d/c).    Prior Function Level of Independence: Independent         Comments: Pt does not drive or do any IADLs.      Hand Dominance   Dominant Hand: Right    Extremity/Trunk Assessment   Upper Extremity Assessment: Defer to OT evaluation           Lower Extremity Assessment: Overall WFL for tasks assessed         Communication   Communication: No difficulties  Cognition Arousal/Alertness: Awake/alert Behavior During Therapy: WFL for tasks assessed/performed Overall Cognitive Status: Within Functional  Limits for tasks assessed                      General Comments      Exercises        Assessment/Plan    PT Assessment  Patient needs continued PT services  PT Diagnosis Difficulty walking;Acute pain   PT Problem List Pain;Decreased balance;Decreased activity tolerance;Decreased mobility  PT Treatment Interventions Balance training;Gait training;Stair training;Functional mobility training;Patient/family education;Therapeutic activities;Therapeutic exercise   PT Goals (Current goals can be found in the Care Plan section) Acute Rehab PT Goals Patient Stated Goal: to make this pain go away PT Goal Formulation: With patient Time For Goal Achievement: 02/04/15 Potential to Achieve Goals: Good    Frequency Min 4X/week   Barriers to discharge        Co-evaluation               End of Session Equipment Utilized During Treatment: Gait belt;Other (comment) (abdominal brace) Activity Tolerance: Patient tolerated treatment well Patient left: in bed;with call bell/phone within reach Nurse Communication: Mobility status         Time: 4742-5956 PT Time Calculation (min) (ACUTE ONLY): 16 min   Charges:   PT Evaluation $Initial PT Evaluation Tier I: 1 Procedure     PT G CodesCandy Sledge A 02-19-2015, 4:21 PM Candy Sledge, PT, DPT [phone]

## 2015-01-21 NOTE — Anesthesia Postprocedure Evaluation (Signed)
  Anesthesia Post-op Note  Patient: Russell Mendoza  Procedure(s) Performed: Procedure(s): HERNIA REPAIR VENTRAL ADULT (N/A)  Patient Location: PACU  Anesthesia Type:General  Level of Consciousness: awake and alert   Airway and Oxygen Therapy: Patient Spontanous Breathing  Post-op Pain: mild  Post-op Assessment: Post-op Vital signs reviewed  Post-op Vital Signs: Reviewed  Last Vitals:  Filed Vitals:   01/21/15 0743  BP: 126/72  Pulse: 87  Temp:   Resp:     Complications: No apparent anesthesia complications

## 2015-01-21 NOTE — Progress Notes (Signed)
Patient ID: Russell Mendoza, male   DOB: 1946-05-06, 69 y.o.   MRN: [id number]     [address] [place], [place] [address]    Phone: [phone] FAX: [phone]     Subjective: Passing flatus.  VSS.  Afebrile.   Objective:  Vital signs:  Filed Vitals:   01/20/15 2247 01/21/15 0500 01/21/15 0550 01/21/15 0743  BP: 139/74  119/72 126/72  Pulse: 93  88 87  Temp: 98.8 F (37.1 C)  98.8 F (37.1 C)   TempSrc: Oral  Oral   Resp: 16  16   Height:      Weight:  71.4 kg (157 lb 6.5 oz)    SpO2: 95%  92%     Last BM Date: 01/19/15  Intake/Output   Yesterday:  04/12 0701 - 04/13 0700 In: 1400 [P.O.:1400] Out: 2165 [Urine:2000; Drains:165] This shift: I/O last 3 completed shifts: In: 2080.8 [P.O.:1475; I.V.:605.8] Out: 0263 [Urine:4400; Drains:270]    Physical Exam: General: Pt awake/alert/oriented x4 in no acute distress Chest: cta.  No chest wall pain w good excursion CV:  Pulses intact.  Regular rhythm MS: Normal AROM mjr joints.  No obvious deformity Abdomen: Soft.  Distended.  Dressing removed, staples in place.  JP drains with serosanguinous output.   No evidence of peritonitis.  No incarcerated hernias. Ext:  SCDs BLE.  No mjr edema.  No cyanosis Skin: No petechiae / purpura   Problem List:   Principal Problem:   Partial small bowel obstruction Active Problems:   OA (osteoarthritis)   Anxiety disorder   Drug tolerance   Essential hypertension, benign   Abdominal pain, acute, epigastric   Nausea & vomiting   Ventral hernia   PUD (peptic ulcer disease)   AAA (abdominal aortic aneurysm) without rupture    Results:   Labs: Results for orders placed or performed during the hospital encounter of 01/17/15 (from the past 48 hour(s))  CBC     Status: Abnormal   Collection Time: 01/20/15  9:38 AM  Result Value Ref Range   WBC 10.3 4.0 - 10.5 K/uL   RBC 3.85 (L) 4.22 - 5.81 MIL/uL   Hemoglobin  10.0 (L) 13.0 - 17.0 g/dL   HCT 33.0 (L) 39.0 - 52.0 %   MCV 85.7 78.0 - 100.0 fL   MCH 26.0 26.0 - 34.0 pg   MCHC 30.3 30.0 - 36.0 g/dL   RDW 15.0 11.5 - 15.5 %   Platelets 317 150 - 400 K/uL  Basic metabolic panel     Status: Abnormal   Collection Time: 01/20/15  9:38 AM  Result Value Ref Range   Sodium 136 135 - 145 mmol/L   Potassium 3.7 3.5 - 5.1 mmol/L   Chloride 102 96 - 112 mmol/L   CO2 25 19 - 32 mmol/L   Glucose, Bld 111 (H) 70 - 99 mg/dL   BUN 8 6 - 23 mg/dL   Creatinine, Ser 0.80 0.50 - 1.35 mg/dL   Calcium 8.2 (L) 8.4 - 10.5 mg/dL   GFR calc non Af Amer 89 (L) >90 mL/min   GFR calc Af Amer >90 >90 mL/min    Comment: (NOTE) The eGFR has been calculated using the CKD EPI equation. This calculation has not been validated in all clinical situations. eGFR's persistently <90 mL/min signify possible Chronic Kidney Disease.    Anion gap 9 5 - 15    Imaging / Studies: No  results found.  Medications / Allergies:  Scheduled Meds: . ALPRAZolam  0.5 mg Oral TID  . aspirin EC  325 mg Oral Daily  . budesonide (PULMICORT) nebulizer solution  0.25 mg Nebulization BID  . carvedilol  12.5 mg Oral BID WC  . enoxaparin (LOVENOX) injection  40 mg Subcutaneous Q24H  . nicotine  14 mg Transdermal Daily  . OxyCODONE  30 mg Oral TID  . pantoprazole  40 mg Oral Q1200   Continuous Infusions:  PRN Meds:.acetaminophen, albuterol, alum & mag hydroxide-simeth, hydrALAZINE, HYDROmorphone (DILAUDID) injection, ondansetron **OR** ondansetron (ZOFRAN) IV, oxyCODONE-acetaminophen  Antibiotics: Anti-infectives    Start     Dose/Rate Route Frequency Ordered Stop   01/19/15 1030  ceFAZolin (ANCEF) IVPB 2 g/50 mL premix     2 g 100 mL/hr over 30 Minutes Intravenous On call to O.R. 01/19/15 0925 01/19/15 1159   01/18/15 0600  ceFAZolin (ANCEF) IVPB 2 g/50 mL premix     2 g 100 mL/hr over 30 Minutes Intravenous On call to O.R. 01/17/15 2104 01/19/15 0559       Assessment/Plan POD#2  ventral hernia repair---Dr. Darene Lamer Cornett -full liquid diet -DC foley -chronic opioid use for "rheumatoid arthritis" According to [facility] the patient takes 22m oxycodone IR TID, has not had Xanax Rx since January.  I will therefore change his oxycodone to 132m give percocet q4h PRN since he just had surgery and IV dilaudid for breakthrough pain. -continue drains, pt to be discharged home with drains and f/u with Dr. CoBrantley Stagen 2 weeks  -PT eval, mobilize.  Refused yesterday, I strongly encouraged him to start ambulating.   -IS -SCD/lovenox COPD-pulmonary toilet HTN-home meds, stable Tobacco use-nicotine patch, encourage cessation  GERD Dispo--apparently fired his PCP, will need Rx for meds, understands this is short term and he has to establish was a new PCP.  Home when tolerating solids, having BMs and pain controlled.   EmErby PianANFlorida Surgery Center Enterprises LLCurgery Pager [phone] consults and floor pages call [phone](7A-4:30P)  01/21/2015 9:05 AM

## 2015-01-22 MED ORDER — HYDROXYZINE HCL 25 MG PO TABS
12.5000 mg | ORAL_TABLET | Freq: Four times a day (QID) | ORAL | Status: DC | PRN
  Administered 2015-01-24: 25 mg via ORAL
  Filled 2015-01-22: qty 1

## 2015-01-22 NOTE — Progress Notes (Signed)
Patient ID: Russell Mendoza, male   DOB: 03/31/1946, 69 y.o.   MRN: [id number]     [address] [place], [place] [address]    Phone: [phone] FAX: [phone]     Subjective: VSS.  Afebrile.  C/o pain per usual, last IV dose was 4/13 0100.  Ambulating, therapies recommending supervision, lives with SO who can help.  Had a BM.  Tolerating fulls.  Voiding.  Doesn't have a ride home.   Objective:  Vital signs:  Filed Vitals:   01/21/15 1525 01/21/15 1952 01/21/15 2147 01/22/15 0539  BP: 124/72  109/67 130/68  Pulse: 88 89 84 79  Temp: 98 F (36.7 C)  98.9 F (37.2 C) 98.4 F (36.9 C)  TempSrc: Oral  Oral Oral  Resp: 16 16 16 18   Height:      Weight:      SpO2: 93% 92% 92% 94%    Last BM Date: 01/21/15  Intake/Output   Yesterday:  04/13 0701 - 04/14 0700 In: 240 [P.O.:240] Out: 145 [Drains:145] This shift: I/O last 3 completed shifts: In: 1400 [P.O.:1400] Out: 1705 [Urine:1450; Drains:255]    Physical Exam: General: Pt awake/alert/oriented x4 in no acute distress Chest: cta.  No chest wall pain w good excursion CV:  Pulses intact.  Regular rhythm MS: Normal AROM mjr joints.  No obvious deformity Abdomen: Soft.  Nondistended.   Mildly tender at incisions only. abd binder.  Staples in place.  No evidence of peritonitis.  No incarcerated hernias. Ext:  SCDs BLE.  No mjr edema.  No cyanosis Skin: No petechiae / purpura   Problem List:   Principal Problem:   Partial small bowel obstruction Active Problems:   OA (osteoarthritis)   Anxiety disorder   Drug tolerance   Essential hypertension, benign   Abdominal pain, acute, epigastric   Nausea & vomiting   Ventral hernia   PUD (peptic ulcer disease)   AAA (abdominal aortic aneurysm) without rupture    Results:   Labs: Results for orders placed or performed during the hospital encounter of 01/17/15 (from the past 48 hour(s))  CBC     Status:  Abnormal   Collection Time: 01/20/15  9:38 AM  Result Value Ref Range   WBC 10.3 4.0 - 10.5 K/uL   RBC 3.85 (L) 4.22 - 5.81 MIL/uL   Hemoglobin 10.0 (L) 13.0 - 17.0 g/dL   HCT 33.0 (L) 39.0 - 52.0 %   MCV 85.7 78.0 - 100.0 fL   MCH 26.0 26.0 - 34.0 pg   MCHC 30.3 30.0 - 36.0 g/dL   RDW 15.0 11.5 - 15.5 %   Platelets 317 150 - 400 K/uL  Basic metabolic panel     Status: Abnormal   Collection Time: 01/20/15  9:38 AM  Result Value Ref Range   Sodium 136 135 - 145 mmol/L   Potassium 3.7 3.5 - 5.1 mmol/L   Chloride 102 96 - 112 mmol/L   CO2 25 19 - 32 mmol/L   Glucose, Bld 111 (H) 70 - 99 mg/dL   BUN 8 6 - 23 mg/dL   Creatinine, Ser 0.80 0.50 - 1.35 mg/dL   Calcium 8.2 (L) 8.4 - 10.5 mg/dL   GFR calc non Af Amer 89 (L) >90 mL/min   GFR calc Af Amer >90 >90 mL/min    Comment: (NOTE) The eGFR has been calculated using the CKD EPI equation. This calculation has not been  validated in all clinical situations. eGFR's persistently <90 mL/min signify possible Chronic Kidney Disease.    Anion gap 9 5 - 15    Imaging / Studies: No results found.  Medications / Allergies:  Scheduled Meds: . ALPRAZolam  0.5 mg Oral TID  . aspirin EC  325 mg Oral Daily  . budesonide (PULMICORT) nebulizer solution  0.25 mg Nebulization BID  . carvedilol  12.5 mg Oral BID WC  . enoxaparin (LOVENOX) injection  40 mg Subcutaneous Q24H  . nicotine  14 mg Transdermal Daily  . oxyCODONE  10 mg Oral TID  . pantoprazole  40 mg Oral Q1200   Continuous Infusions:  PRN Meds:.acetaminophen, albuterol, alum & mag hydroxide-simeth, hydrALAZINE, hydrOXYzine, ondansetron **OR** ondansetron (ZOFRAN) IV, oxyCODONE-acetaminophen  Antibiotics: Anti-infectives    Start     Dose/Rate Route Frequency Ordered Stop   01/19/15 1030  ceFAZolin (ANCEF) IVPB 2 g/50 mL premix     2 g 100 mL/hr over 30 Minutes Intravenous On call to O.R. 01/19/15 0925 01/19/15 1159   01/18/15 0600  ceFAZolin (ANCEF) IVPB 2 g/50 mL premix      2 g 100 mL/hr over 30 Minutes Intravenous On call to O.R. 01/17/15 2104 01/19/15 0559       Assessment/Plan POD#3 ventral hernia repair---Dr. Darene Lamer Cornett - 01/19/2015  -advance to soft diet  -chronic opioid use for "rheumatoid arthritis" According to [facility] the patient takes 35m oxycodone IR TID, has not had Xanax Rx since January.Will continue with oxy 143mTID plus percocet 1-2 tablets for breakthrough, DC dilaudid.  Add atarax for anxiety   -continue drains, pt to be discharged home with drains and f/u with Dr. CoBrantley Stagen 2 weeks.  Do not schedule a nurse visit for staples because the patient doesn't have transportation  -PT eval, mobilize. Refused yesterday, I strongly encouraged him to start ambulating.  -IS -SCD/lovenox COPD-pulmonary toilet HTN-home meds, stable Tobacco use-nicotine patch, encourage cessation  GERD Dispo--apparently fired his PCP, will need Rx for meds, understands this is short term and he has to establish was a new PCP. DC later today  EmErby PianANPrisma Health [facility] Hospitalurgery Pager [phone] consults and floor pages call [phone](7A-4:30P)  01/22/2015 9:13 AM  Agree with above. Up walking with PT. He does not think that his pain is well enough controlled to go home.  DaAlphonsa OverallMD, FAHawkins County Memorial Hospitalurgery Pager: [phone] phone:  [phone]

## 2015-01-22 NOTE — Progress Notes (Signed)
Physical Therapy Treatment Patient Details Name: Russell Mendoza MRN: [id number] DOB: Jul 14, 1946 Today's Date: 01/22/2015    History of Present Illness Patient is a 69 y/o male s/p ventral hernia repair 4/11. PMH of chronic pain and IM nailing left hip 04/2014.    PT Comments    Patient progressing well with mobility. Continues to be limited mainly by pain. Requires coaxing to participate in therapy.Tolerated stair negotiation. Reviewed bracing for coughing/sneezing. Pt nervous about going home today due to lack of pain control. Will continue to follow per current POC to improve endurance, balance and safety.   Follow Up Recommendations  No PT follow up;Supervision/Assistance - 24 hour     Equipment Recommendations  None recommended by PT    Recommendations for Other Services       Precautions / Restrictions Precautions Precautions: Fall Required Braces or Orthoses: Other Brace/Splint Other Brace/Splint: abdominal binder Restrictions Weight Bearing Restrictions: No    Mobility  Bed Mobility Overal bed mobility: Needs Assistance Bed Mobility: Rolling;Sidelying to Sit;Sit to Sidelying Rolling: Supervision Sidelying to sit: Supervision;HOB elevated     Sit to sidelying: Supervision;HOB elevated General bed mobility comments: Heavy use of rails for support. Good demo of log roll technique. Pt climbs into bed with knees first and goes onto side to simulate log roll technique.  Transfers Overall transfer level: Needs assistance Equipment used: None Transfers: Sit to/from Stand Sit to Stand: Supervision         General transfer comment: Supervision for safety.   Ambulation/Gait Ambulation/Gait assistance: Min guard Ambulation Distance (Feet): 200 Feet Assistive device: None Gait Pattern/deviations: Step-through pattern;Decreased stride length;Drifts right/left   Gait velocity interpretation: Below normal speed for age/gender General Gait Details: Pt mildly unsteady  during gait, drifting to right/left, no overt LOB.   Stairs Stairs: Yes Stairs assistance: Supervision Stair Management: One rail Left;Alternating pattern;Step to pattern Number of Stairs: 12 General stair comments: Use of rail for support. Dyspnea present at top of steps.   Wheelchair Mobility    Modified Rankin (Stroke Patients Only)       Balance Overall balance assessment: Needs assistance Sitting-balance support: Feet supported;No upper extremity supported Sitting balance-[name]: Good     Standing balance support: During functional activity Standing balance-[name]: Good Standing balance comment: Pt able to pick up object off floor without LOB or difficulty.                     Cognition Arousal/Alertness: Awake/alert Behavior During Therapy: WFL for tasks assessed/performed Overall Cognitive Status: Within Functional Limits for tasks assessed                      Exercises      General Comments        Pertinent Vitals/Pain Pain Assessment: 0-10 Pain Score: 10-Worst pain ever Pain Location: abdomen Pain Descriptors / Indicators: Sore Pain Intervention(s): Limited activity within patient's tolerance;Monitored during session;Repositioned    Home Living                      Prior Function            PT Goals (current goals can now be found in the care plan section) Progress towards PT goals: Progressing toward goals    Frequency  Min 4X/week    PT Plan Current plan remains appropriate    Co-evaluation             End of Session Equipment Utilized  During Treatment: Gait belt (abdominal binder) Activity Tolerance: Patient tolerated treatment well;Patient limited by pain Patient left: in bed;with call bell/phone within reach     Time: 1040-1057 PT Time Calculation (min) (ACUTE ONLY): 17 min  Charges:  $Gait Training: 8-22 mins                    G CodesCandy Sledge A 02/21/2015, 11:28  AM Candy Sledge, [place], DPT [phone]

## 2015-01-22 NOTE — Care Management Note (Signed)
  Page 1 of 1   01/22/2015     11:10:49 AM CARE MANAGEMENT NOTE 01/22/2015  Patient:  Russell Mendoza, Russell Mendoza   Account Number:  [id number]  Date Initiated:  01/20/2015  Documentation initiated by:  Elenor Quinones  Subjective/Objective Assessment:   Abdominal Pain, Ventral Hernia     Action/Plan:   Anticipated DC Date:  01/22/2015   Anticipated DC Plan:  [place]         Choice offered to / List presented to:  C-1 Patient        [place] arranged  HH-1 RN      [place].   Status of service:  Completed, signed off Medicare Important Message given?  YES (If response is "NO", the following Medicare IM given date fields will be blank) Date Medicare IM given:  01/20/2015 Medicare IM given by:  Elenor Quinones Date Additional Medicare IM given:  01/22/2015 Additional Medicare IM given by:  Magdalen Spatz  Discharge Disposition:  [place]  Per UR Regulation:  Reviewed for med. necessity/level of care/duration of stay  If discussed at [place] of Stay Meetings, dates discussed:   01/22/2015    Comments:  01-22-15 Patient is discharging to his girlfriend's address : [place] phone [phone]  Patient's cell is [phone] .  Magdalen Spatz RN BSN [id number]     01/20/15  Per MD order; nurse provided [facility] along with  [place] free clinic phone numbers.  Pt was educated on using the health connect resource to find doctors within [facility] if needed.

## 2015-01-23 ENCOUNTER — Inpatient Hospital Stay (HOSPITAL_COMMUNITY): Payer: Medicare Other

## 2015-01-23 LAB — COMPREHENSIVE METABOLIC PANEL
ALBUMIN: 3.1 g/dL — AB (ref 3.5–5.2)
ALK PHOS: 97 U/L (ref 39–117)
ALT: 8 U/L (ref 0–53)
AST: 16 U/L (ref 0–37)
Anion gap: 9 (ref 5–15)
BILIRUBIN TOTAL: 0.2 mg/dL — AB (ref 0.3–1.2)
BUN: 11 mg/dL (ref 6–23)
CALCIUM: 8.4 mg/dL (ref 8.4–10.5)
CO2: 28 mmol/L (ref 19–32)
CREATININE: 1.05 mg/dL (ref 0.50–1.35)
Chloride: 97 mmol/L (ref 96–112)
GFR, EST AFRICAN AMERICAN: 82 mL/min — AB (ref >90)
GFR, EST NON AFRICAN AMERICAN: 70 mL/min — AB (ref >90)
Glucose, Bld: 155 mg/dL — ABNORMAL HIGH (ref 70–99)
Potassium: 3.8 mmol/L (ref 3.5–5.1)
Sodium: 134 mmol/L — ABNORMAL LOW (ref 135–145)
Total Protein: 6.1 g/dL (ref 6.0–8.3)

## 2015-01-23 MED ORDER — SIMETHICONE 80 MG PO CHEW: 80.0000 mg | CHEWABLE_TABLET | Freq: Four times a day (QID) | ORAL | Status: DC | PRN

## 2015-01-23 MED ORDER — POLYETHYLENE GLYCOL 3350 17 G PO PACK
17.0000 g | PACK | Freq: Two times a day (BID) | ORAL | Status: DC
  Administered 2015-01-23 – 2015-01-24 (×3): 17 g via ORAL
  Filled 2015-01-23 (×3): qty 1

## 2015-01-23 MED ORDER — DOCUSATE SODIUM 283 MG RE ENEM
1.0000 | ENEMA | Freq: Every day | RECTAL | Status: AC
  Filled 2015-01-23: qty 1

## 2015-01-23 NOTE — Progress Notes (Signed)
Patient refused enema, explained about the reasons for it but patient claimed he had a bowel movement. This writer instructed patient to please call RN if he had another bowel movement and not to flush it.

## 2015-01-23 NOTE — Progress Notes (Signed)
Patient offered enema but patient continous to refuse enema.

## 2015-01-23 NOTE — Progress Notes (Signed)
4 Days Post-Op  Subjective: Full of gas cannot pass  Bloated   Objective: Vital signs in last 24 hours: Temp:  [98.2 F (36.8 C)-99.2 F (37.3 C)] 98.9 F (37.2 C) (04/15 0708) Pulse Rate:  [74-81] 74 (04/15 0708) Resp:  [15-18] 16 (04/15 0708) BP: (105-126)/(51-66) 126/60 mmHg (04/15 0708) SpO2:  [94 %-99 %] 94 % (04/15 0708) Weight:  [72.1 kg (158 lb 15.2 oz)-72.2 kg (159 lb 2.8 oz)] 72.2 kg (159 lb 2.8 oz) (04/15 0708) Last BM Date: 01/22/15  Intake/Output from previous day: 04/14 0701 - 04/15 0700 In: 1500 [P.O.:1500] Out: 100 [Drains:100] Intake/Output this shift:    Incision/Wound:distended  Quiet  Incision CDI JP serous  Lab Results:   Recent Labs  01/20/15 0938  WBC 10.3  HGB 10.0*  HCT 33.0*  PLT 317   BMET  Recent Labs  01/20/15 0938  NA 136  K 3.7  CL 102  CO2 25  GLUCOSE 111*  BUN 8  CREATININE 0.80  CALCIUM 8.2*   PT/INR No results for input(s): LABPROT, INR in the last 72 hours. ABG No results for input(s): PHART, HCO3 in the last 72 hours.  Invalid input(s): PCO2, PO2  Studies/Results: No results found.  Anti-infectives: Anti-infectives    Start     Dose/Rate Route Frequency Ordered Stop   01/19/15 1030  ceFAZolin (ANCEF) IVPB 2 g/50 mL premix     2 g 100 mL/hr over 30 Minutes Intravenous On call to O.R. 01/19/15 0925 01/19/15 1159   01/18/15 0600  ceFAZolin (ANCEF) IVPB 2 g/50 mL premix     2 g 100 mL/hr over 30 Minutes Intravenous On call to O.R. 01/17/15 2104 01/19/15 0559      Assessment/Plan: s/p Procedure(s): HERNIA REPAIR VENTRAL ADULT (N/A) Abdominal distention check lytes and KUB.    Enema   And miralax.  On regular food may need to slow down    Keep drains at discharge    LOS: 6 days    Corina Stacy A. 01/23/2015

## 2015-01-23 NOTE — Progress Notes (Signed)
PT Cancellation Note  Patient Details Name: Russell Mendoza MRN: [id number] DOB: 1946/02/26   Cancelled Treatment:    Reason Eval/Treat Not Completed: Patient declined, no reason specified;Pain limiting ability to participate Pt declined in Am and in PM to work with therapy secondary to increased pain. Reports walking in hallways earlier. Will follow up next available time.   Candy Sledge A 01/23/2015, 4:01 PM Candy Sledge, [place], DPT [phone]

## 2015-01-24 MED ORDER — OXYCODONE HCL 10 MG PO TABS: 10.0000 mg | ORAL_TABLET | Freq: Three times a day (TID) | ORAL | Status: AC

## 2015-01-24 MED ORDER — POLYETHYLENE GLYCOL 3350 17 G PO PACK: 17.0000 g | PACK | Freq: Two times a day (BID) | ORAL | Status: AC

## 2015-01-24 MED ORDER — OXYCODONE-ACETAMINOPHEN 5-325 MG PO TABS: 1.0000 | ORAL_TABLET | Freq: Four times a day (QID) | ORAL | Status: AC | PRN

## 2015-01-24 MED ORDER — ALPRAZOLAM 0.5 MG PO TABS: ORAL_TABLET | ORAL | Status: AC

## 2015-01-24 MED ORDER — CARVEDILOL 12.5 MG PO TABS: 12.5000 mg | ORAL_TABLET | Freq: Two times a day (BID) | ORAL | Status: AC

## 2015-01-24 NOTE — Discharge Summary (Signed)
Physician Discharge Summary  Russell Mendoza EGB:[id number] DOB: 02/13/1946 DOA: 01/17/2015  PCP: No primary care provider on file.  Consultation: IM  Admit date: 01/17/2015 Discharge date: 01/24/2015  Recommendations for Outpatient Follow-up:   Follow-up Information    Follow up with Russell Mendoza A., MD On 02/03/2015.   Specialty:  General Surgery   Why:  you need to be seen on this day for staples removal and a post op check.  please call our office to schedule a time per dr. Clarice Mendoza information:   [address] [place] [address] [phone]      Discharge Diagnoses:  1. Ventral hernia repair 2. COPD 3. HTN 4. Tobacco use   Surgical Procedure: ventral hernia repair---Dr. Brantley Stage 01/19/2015  Discharge Condition: stasble Disposition: home  Diet recommendation: heart healthy  Filed Weights   01/23/15 0500 01/23/15 0708 01/24/15 0553  Weight: 72.1 kg (158 lb 15.2 oz) 72.2 kg (159 lb 2.8 oz) 73 kg (160 lb 15 oz)    Filed Vitals:   01/24/15 0553  BP: 124/71  Pulse: 71  Temp: 97.3 F (36.3 C)  Resp: 19      Hospital Course:  The patient presented to [facility] with a PSBO secondary to a hernia.  Initially admitted to medicine service but transferred onto surgical service post operatively.  He underwent the procedure listed above and was transferred back to the floor.  He was mobilized with therapies.  Vital signs remained stable.  He continued to have abdominal pain out of proportion to his exam.  He takes chronic narcotics, but have been significantly reduced in recent months before he "fired" his PCP.  This was therefore resumed and was given percocet for breakthrough pain.  On POD#5 the patient was having BMs, pain was well controlled, ambulating and therefore felt stable for discharge home. He is to be discharged home with the drains and f/u with Dr. Brantley Stage   On 4/26 specifically for staples removal and post op check.  Medication risks, benefits and  therapeutic alternatives were reviewed with the patient.  He verbalizes understanding.  He understands he is unable to obtain more xanax prescriptions from our office.  I provided a 30d supply for his coreg.  Warning signs that warrant immediate attention were discussed.     Physical Exam: General: Pt awake/alert/oriented x4 in no acute distress Chest: cta. No chest wall pain w good excursion CV: Pulses intact. Regular rhythm MS: Normal AROM mjr joints. No obvious deformity Abdomen: Soft.nondistended. staples in place. JP drains with serosanguinous output. No evidence of peritonitis. No incarcerated hernias. Ext: SCDs BLE. No mjr edema. No cyanosis Skin: No petechiae / purpura    Discharge Instructions     Medication List    STOP taking these medications        enoxaparin 40 MG/0.4ML injection  Commonly known as:  LOVENOX     methocarbamol 500 MG tablet  Commonly known as:  ROBAXIN     OxyCODONE 40 mg T12a 12 hr tablet  Commonly known as:  OXYCONTIN  Replaced by:  Oxycodone HCl 10 MG Tabs     oxyCODONE-acetaminophen 10-325 MG per tablet  Commonly known as:  PERCOCET  Replaced by:  oxyCODONE-acetaminophen 5-325 MG per tablet      TAKE these medications        ALPRAZolam 0.5 MG tablet  Commonly known as:  XANAX  Take one tablet by mouth three times daily for anxiety     aspirin EC  325 MG tablet  Take 325 mg by mouth daily.     budesonide-formoterol 160-4.5 MCG/ACT inhaler  Commonly known as:  SYMBICORT  Inhale 2 puffs into the lungs daily as needed (wheezing and shortness of breath).     carvedilol 12.5 MG tablet  Commonly known as:  COREG  Take 1 tablet (12.5 mg total) by mouth 2 (two) times daily with a meal.     Oxycodone HCl 10 MG Tabs  Take 1 tablet (10 mg total) by mouth 3 (three) times daily.     oxyCODONE-acetaminophen 5-325 MG per tablet  Commonly known as:  PERCOCET/ROXICET  Take 1-2 tablets by mouth every 6 (six) hours as needed  (breakthrough pain).     polyethylene glycol packet  Commonly known as:  MIRALAX / GLYCOLAX  Take 17 g by mouth 2 (two) times daily.           Follow-up Information    Follow up with Russell Mendoza A., MD On 02/03/2015.   Specialty:  General Surgery   Why:  you need to be seen on this day for staples removal and a post op check.  please call our office to schedule a time per dr. Clarice Mendoza information:   [address] [place] [address] [phone]        The results of significant diagnostics from this hospitalization (including imaging, microbiology, ancillary and laboratory) are listed below for reference.    Significant Diagnostic Studies: Ct Abdomen Pelvis Wo Contrast  01/17/2015   CLINICAL DATA:  Abdominal pain. Abdominal distention. Left bowel movement 2 days ago. Substernal chest pain. Mild nausea.  EXAM: CT ABDOMEN AND PELVIS WITHOUT CONTRAST  TECHNIQUE: Multidetector CT imaging of the abdomen and pelvis was performed following the standard protocol without IV contrast.  COMPARISON:  CT of the chest, abdomen, and pelvis 04/23/2014  FINDINGS: Mild dependent atelectasis is present at the medial right lung base. The lungs are otherwise clear. The heart size is within normal limits. Coronary artery calcifications are evident. No significant pleural or pericardial effusion is present.  The liver and spleen are within normal limits. The stomach, duodenum, and pancreas are within normal limits. The common bile duct and gallbladder are normal. The adrenal glands are within normal limits bilaterally. A a cystic lesion at the upper Mendoza of the right kidney is slightly increased in size since the prior exam. Left-sided cystic lesions are stable. Mild stranding is again seen about both kidneys. The ureters are within normal limits. Urinary bladder is unremarkable. The prostate gland is mildly enlarged, measuring 5.0 cm in transverse diameter. A central calcification is  present. No focal mass lesion is evident.  Mild diverticular changes are present in the sigmoid colon without focal inflammation to suggest diverticulitis. The remainder the colon is within normal limits. The appendix is visualized and normal.  The new ventral hernia is noted. This measures 10 cm in transverse diameter. A portion of the transverse colon extends into the hernia. There are also portions of small bowel in the hernia. The small bowel proximal to this area is slightly dilated with fluid levels. The small bowel distal is decompressed. There is contrast in the colon.  No significant adenopathy or free fluid is present. Fat herniates into the cord canals bilaterally as before. There is no associated bowel in the inguinal canals.  Aneurysmal enlargement of the aorta is similar to the prior study, measuring 36 mm in maximal transverse diameter.  Multilevel degenerative changes are  present within the lumbar spine. Endplate changes and Schmorl's nodes are similar to the prior exam. No acute fracture is present. No focal lytic or blastic lesions are evident.  IMPRESSION: 1. New ventral hernia. The hernia measures 10 cm in transverse diameter. There is both large and small bowel within the hernia. 2. A partial small bowel obstruction is suspected with mild proximal dilation and fluid levels. Contrast does traverse this area into the colon. 3. Mild dependent atelectasis the right lung base. 4. Stable renal cystic disease. 5. Stable prostatomegaly. 6. Mild sigmoid diverticulosis without diverticulitis. 7. Fat herniates into the inguinal canals bilaterally. 8. Similar appearance of abdominal aortic aneurysm measuring 3.6 cm maximally. Recommend followup by ultrasound in 3 years. This recommendation follows ACR consensus guidelines: White Paper of the ACR Incidental Findings Committee II on Vascular Findings. J Am Coll Radiol 2013; 03:009-233 9. Stable degenerative changes in the lumbar spine.   Electronically Signed    By: San Morelle M.D.   On: 01/17/2015 18:21   Dg Abd 1 View  01/23/2015   CLINICAL DATA:  Obstipation  EXAM: ABDOMEN - 1 VIEW  COMPARISON:  CT 01/17/2015  FINDINGS: The bowel gas pattern is normal. No radio-opaque calculi or other significant radiographic abnormality are seen. Minimal retained contrast in nondilated colon is noted. Midline surgical staples are identified. Right dynamic femoral nail partly visualized. Presumed surgical drainage catheters project over the abdomen.  IMPRESSION: Normal bowel gas pattern with mild stool burden.   Electronically Signed   By: Conchita Paris M.D.   On: 01/23/2015 10:17   Dg Chest Port 1 View  01/17/2015   CLINICAL DATA:  Acute chest pain.  EXAM: PORTABLE CHEST - 1 VIEW  COMPARISON:  September 23, 2014.  FINDINGS: The heart size and mediastinal contours are within normal limits. Both lungs are clear. No pneumothorax or pleural effusion is noted. The visualized skeletal structures are unremarkable.  IMPRESSION: No acute cardiopulmonary abnormality seen.   Electronically Signed   By: Marijo Conception, M.D.   On: 01/17/2015 14:29   Dg Abd Portable 1v  01/17/2015   CLINICAL DATA:  Generalized abdominal pain for several days.  EXAM: PORTABLE ABDOMEN - 1 VIEW  COMPARISON:  None.  FINDINGS: The bowel gas pattern is normal. No free air is noted on left lateral decubitus view. No radio-opaque calculi or other significant radiographic abnormality are seen.  IMPRESSION: No evidence of bowel obstruction or ileus. No pneumoperitoneum is noted.   Electronically Signed   By: Marijo Conception, M.D.   On: 01/17/2015 14:27    Microbiology: Recent Results (from the past 240 hour(s))  Surgical pcr screen     Status: Abnormal   Collection Time: 01/18/15 12:11 AM  Result Value Ref Range Status   MRSA, PCR NEGATIVE NEGATIVE Final   Staphylococcus aureus POSITIVE (A) NEGATIVE Final    Comment:        The Xpert SA Assay (FDA approved for NASAL specimens in patients over  55 years of age), is one component of a comprehensive surveillance program.  Test performance has been validated by [facility] for patients greater than or equal to 70 year old. It is not intended to diagnose infection nor to guide or monitor treatment.      Labs: Basic Metabolic Panel:  Recent Labs Lab 01/17/15 1335 01/18/15 0953 01/20/15 0938 01/23/15 1110  NA 134* 132* 136 134*  K 3.6 3.7 3.7 3.8  CL 99 101 102 97  CO2 24 21  25 28  GLUCOSE 113* 107* 111* 155*  BUN 9 7 8 11   CREATININE 0.87 0.74 0.80 1.05  CALCIUM 9.1 8.3* 8.2* 8.4   Liver Function Tests:  Recent Labs Lab 01/17/15 1335 01/23/15 1110  AST 16 16  ALT 10 8  ALKPHOS 121* 97  BILITOT 0.6 0.2*  PROT 7.5 6.1  ALBUMIN 4.0 3.1*    Recent Labs Lab 01/17/15 1335  LIPASE 41   No results for input(s): AMMONIA in the last 168 hours. CBC:  Recent Labs Lab 01/17/15 1335 01/18/15 0953 01/20/15 0938  WBC 10.9* 9.4 10.3  NEUTROABS 8.4*  --   --   HGB 10.8* 10.2* 10.0*  HCT 34.7* 33.3* 33.0*  MCV 86.3 86.5 85.7  PLT 372 347 317   Cardiac Enzymes:  Recent Labs Lab 01/17/15 1335  TROPONINI <0.03   BNP: BNP (last 3 results) No results for input(s): BNP in the last 8760 hours.  ProBNP (last 3 results) No results for input(s): PROBNP in the last 8760 hours.  CBG:  Recent Labs Lab 01/17/15 1334  GLUCAP 118*    Principal Problem:   Partial small bowel obstruction Active Problems:   OA (osteoarthritis)   Anxiety disorder   Drug tolerance   Essential hypertension, benign   Abdominal pain, acute, epigastric   Nausea & vomiting   Ventral hernia   PUD (peptic ulcer disease)   AAA (abdominal aortic aneurysm) without rupture   Time coordinating discharge: <30 mins  Signed:  Kimberla Driskill, ANP-BC

## 2015-01-24 NOTE — Progress Notes (Signed)
Discharged instructions reviewed and given. Prescription medication given and reviewed.

## 2015-01-24 NOTE — Progress Notes (Signed)
Pt given chart for recording JP drainage (2) and instructed on how to do this.  Pt has ABD binder on and instructed to wear this when up.  Dressings changed around JP drains, intact.  Staples intact Mid abd incision.

## 2015-01-24 NOTE — Discharge Instructions (Signed)
[facility] _______[facility] ° °UMBILICAL OR INGUINAL HERNIA REPAIR: POST OP INSTRUCTIONS ° °Always review your discharge instruction sheet given to you by the facility where your surgery was performed. °IF YOU HAVE DISABILITY OR FAMILY LEAVE FORMS, YOU MUST BRING THEM TO THE OFFICE FOR PROCESSING.   °DO NOT GIVE THEM TO YOUR DOCTOR. ° °1. A  prescription for pain medication may be given to you upon discharge.  Take your pain medication as prescribed, if needed.  If narcotic pain medicine is not needed, then you may take acetaminophen (Tylenol) or ibuprofen (Advil) as needed. °2. Take your usually prescribed medications unless otherwise directed. °3. If you need a refill on your pain medication, please contact your pharmacy.  They will contact our office to request authorization. Prescriptions will not be filled after 5 pm or on week-ends. °4. You should follow a light diet the first 24 hours after arrival home, such as soup and crackers, etc.  Be sure to include lots of fluids daily.  Resume your normal diet the day after surgery. °5. Most patients will experience some swelling and bruising around the umbilicus or in the groin and scrotum.  Ice packs and reclining will help.  Swelling and bruising can take several days to resolve.  °6. It is common to experience some constipation if taking pain medication after surgery.  Increasing fluid intake and taking a stool softener (such as Colace) will usually help or prevent this problem from occurring.  A mild laxative (Milk of Magnesia or Miralax) should be taken according to package directions if there are no bowel movements after 48 hours. °7. Unless discharge instructions indicate otherwise, you may remove your bandages 24-48 hours after surgery, and you may shower at that time.  You may have steri-strips (small skin tapes) in place directly over the incision.  These strips should be left on the skin for 7-10 days.  If your surgeon used skin glue on the  incision, you may shower in 24 hours.  The glue will flake off over the next 2-3 weeks.  Any sutures or staples will be removed at the office during your follow-up visit. °8. ACTIVITIES:  You may resume regular (light) daily activities beginning the next day--such as daily self-care, walking, climbing stairs--gradually increasing activities as tolerated.  You may have sexual intercourse when it is comfortable.  Refrain from any heavy lifting or straining until approved by your doctor. °a. You may drive when you are no longer taking prescription pain medication, you can comfortably wear a seatbelt, and you can safely maneuver your car and apply brakes. °b. RETURN TO WORK:  __________________________________________________________ °9. You should see your doctor in the office for a follow-up appointment approximately 2-3 weeks after your surgery.  Make sure that you call for this appointment within a day or two after you arrive home to insure a convenient appointment time. °10. OTHER INSTRUCTIONS:  __________________________________________________________________________________________________________________________________________________________________________________________  °WHEN TO CALL YOUR DOCTOR: °1. Fever over 101.0 °2. Inability to urinate °3. Nausea and/or vomiting °4. Extreme swelling or bruising °5. Continued bleeding from incision. °6. Increased pain, redness, or drainage from the incision ° °The clinic staff is available to answer your questions during regular business hours.  Please don’t hesitate to call and ask to speak to one of the nurses for clinical concerns.  If you have a medical emergency, go to the nearest emergency room or call 911.  A surgeon from [facility] is always on call at the hospital ° ° °  [address], [place], [place], [place]  [address] ?  [address], [place], [place]   [address] [phone] ? [phone] ? FAX [phone] Web site: [email or web address]   You  need to see a primary care doctor for further prescriptions for pain meds, xanax and your blood pressure medication.

## 2016-04-16 IMAGING — CR DG HIP COMPLETE 2+V*R*
3 series · 3 of 3 positions shown · non-contrast
Comparison: None

CLINICAL DATA: RIGHT hip fracture post fall

EXAM:
RIGHT HIP - COMPLETE 2+ VIEW

[w hip lat right]
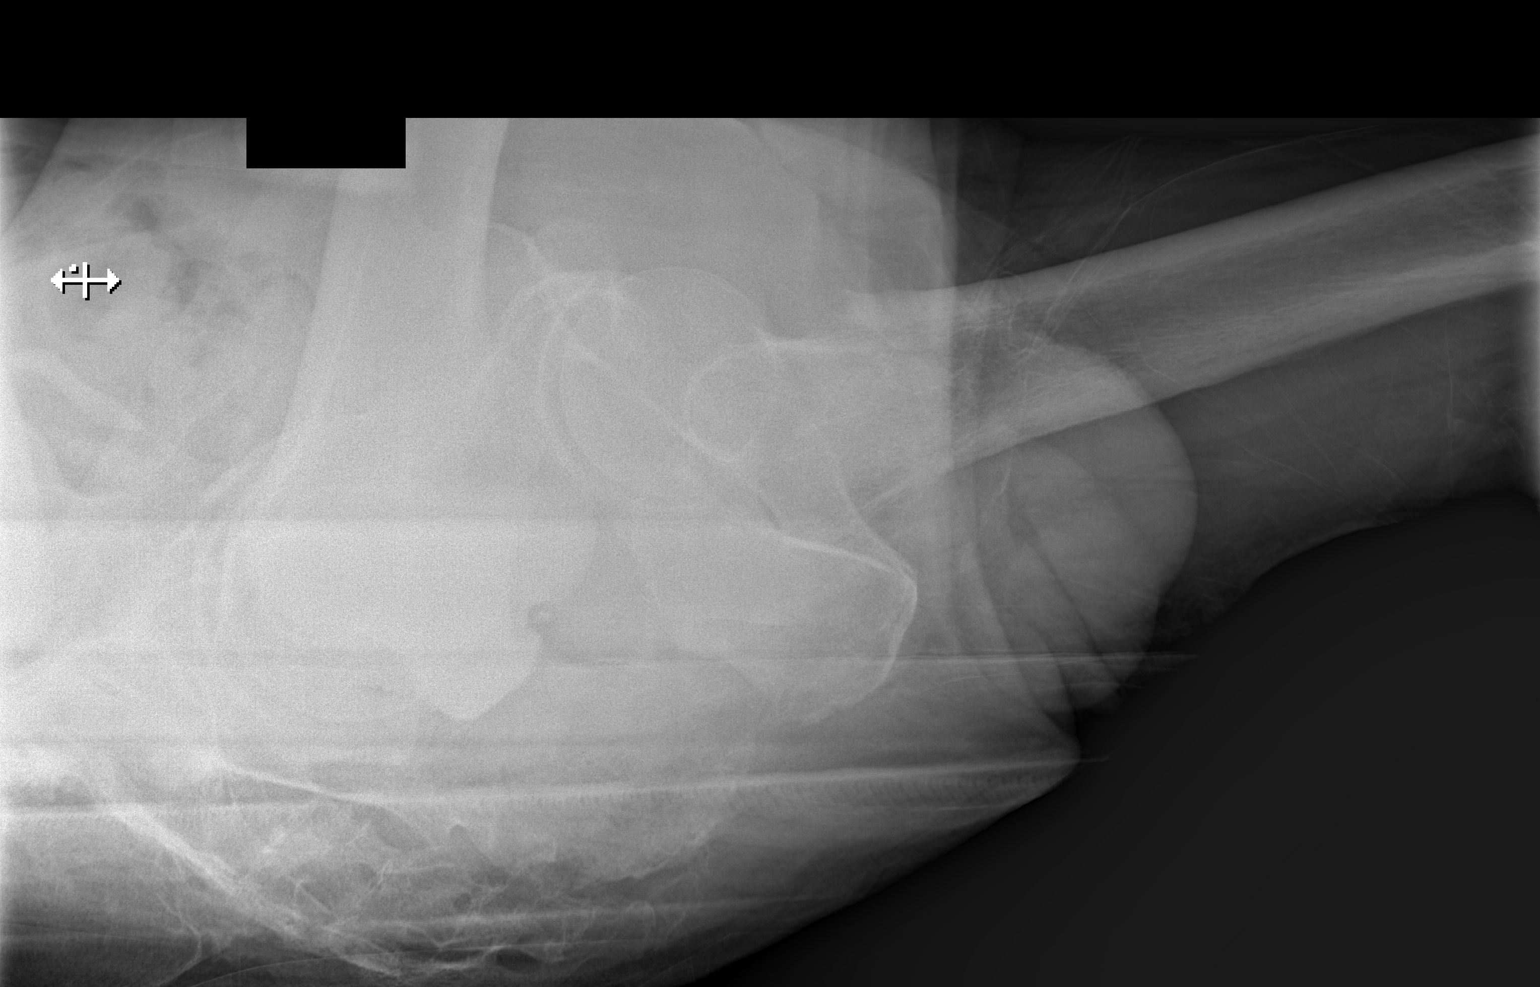

[t pelvis ap]
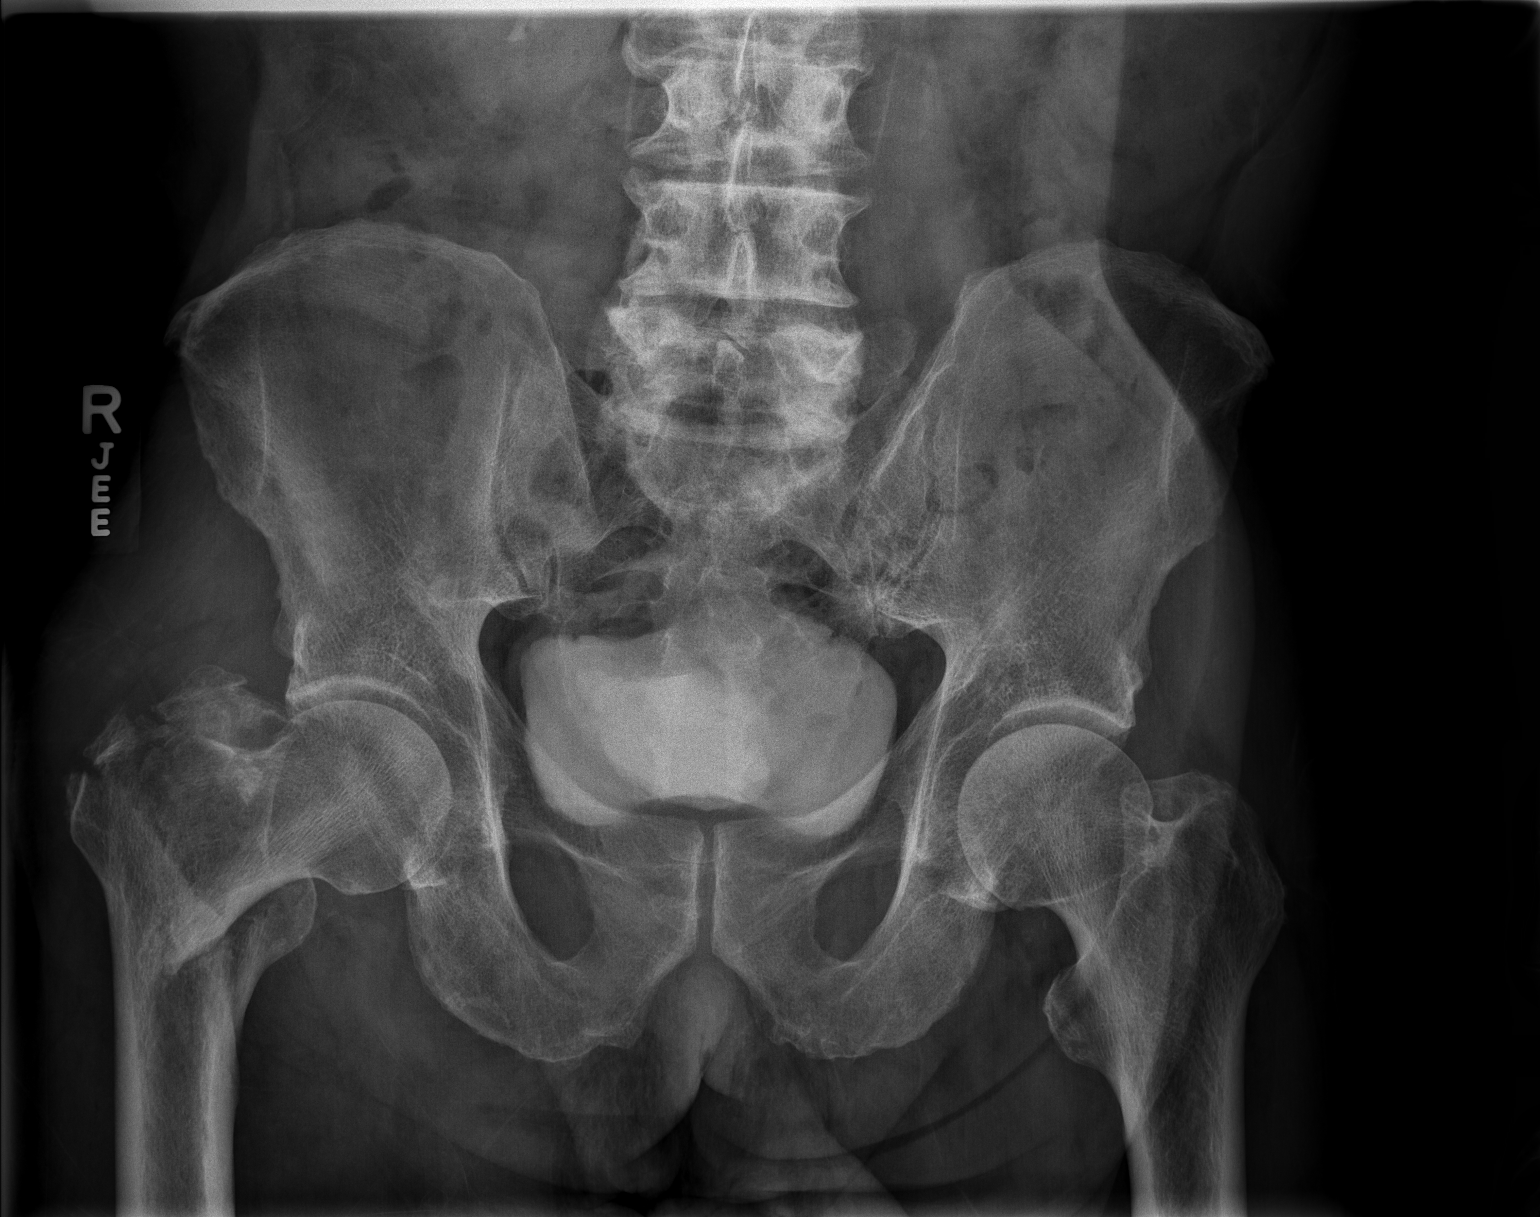

[t hip ap right]
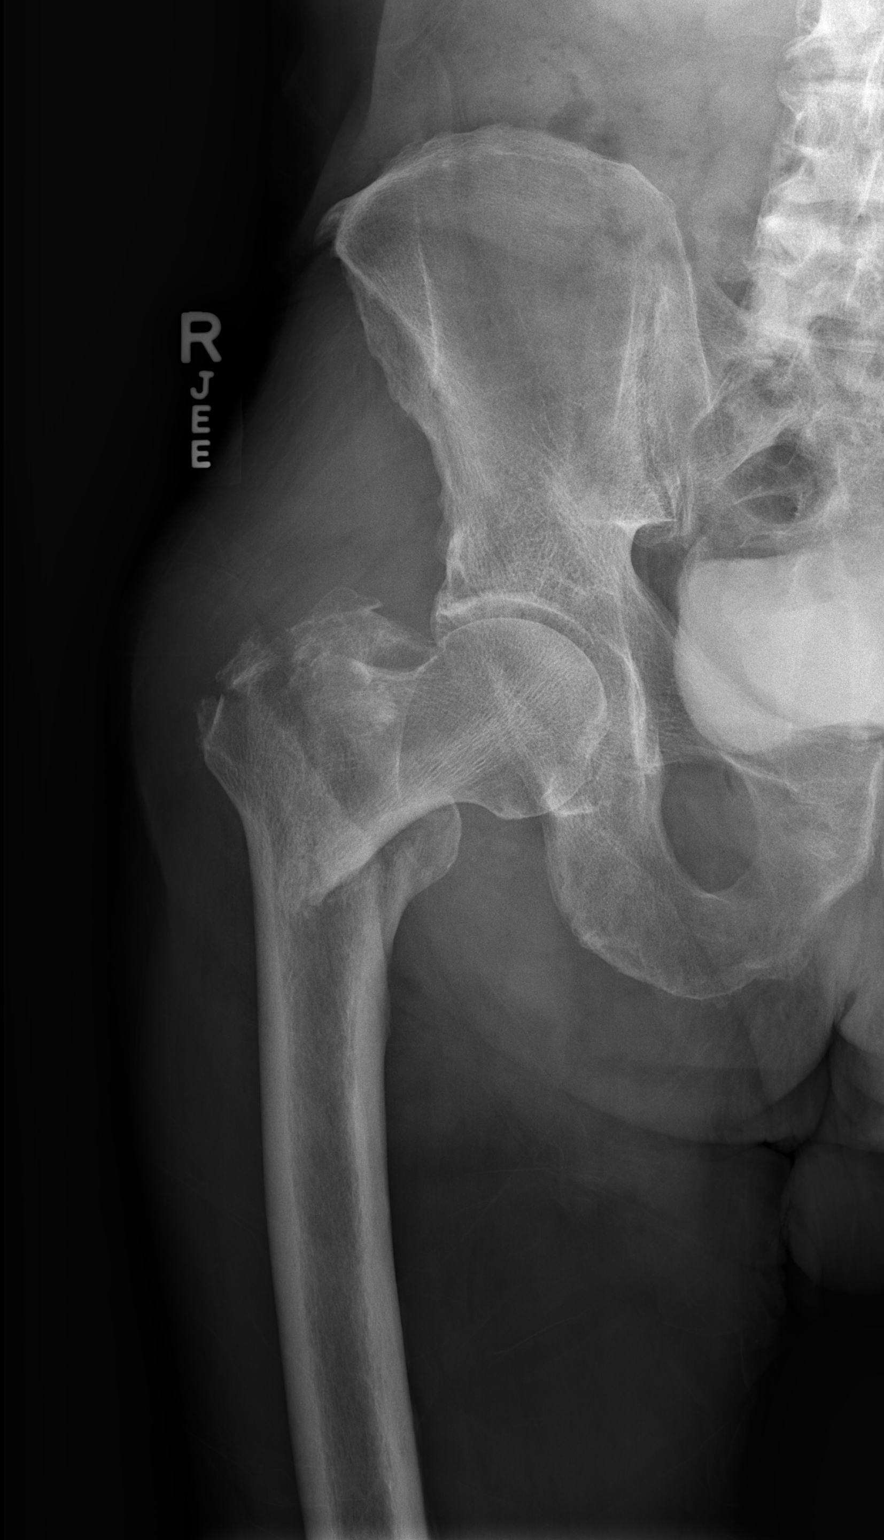

[3 of 3 positions shown; findings below may reference images not displayed]

FINDINGS: Excreted contrast material within urinary bladder from preceding CT,
with indentation of the bladder base suggesting enlarged prostate
gland.

Comminuted displaced intertrochanteric fracture RIGHT femur with
varus angulation.

Diffuse osseous demineralization.

Symmetric hip and SI joints.

Degenerative disc disease changes lower lumbar spine.

No additional fracture, dislocation or bone destruction.
IMPRESSION: Comminuted displaced and angulated intertrochanteric fracture RIGHT
femur.

## 2016-04-17 IMAGING — RF DG HIP OPERATIVE*R*
1 series · 4 of 4 positions shown · non-contrast
Comparison: None.

CLINICAL DATA: ORIF right intertrochanteric fracture

EXAM:
DG OPERATIVE RIGHT HIP
FLUOROSCOPY TIME:  1 min 13 seconds
TECHNIQUE: A single spot fluoroscopic AP image of the right hip is submitted.

[Series 1: run · 4 of 4 slices shown]
[im 1/4]
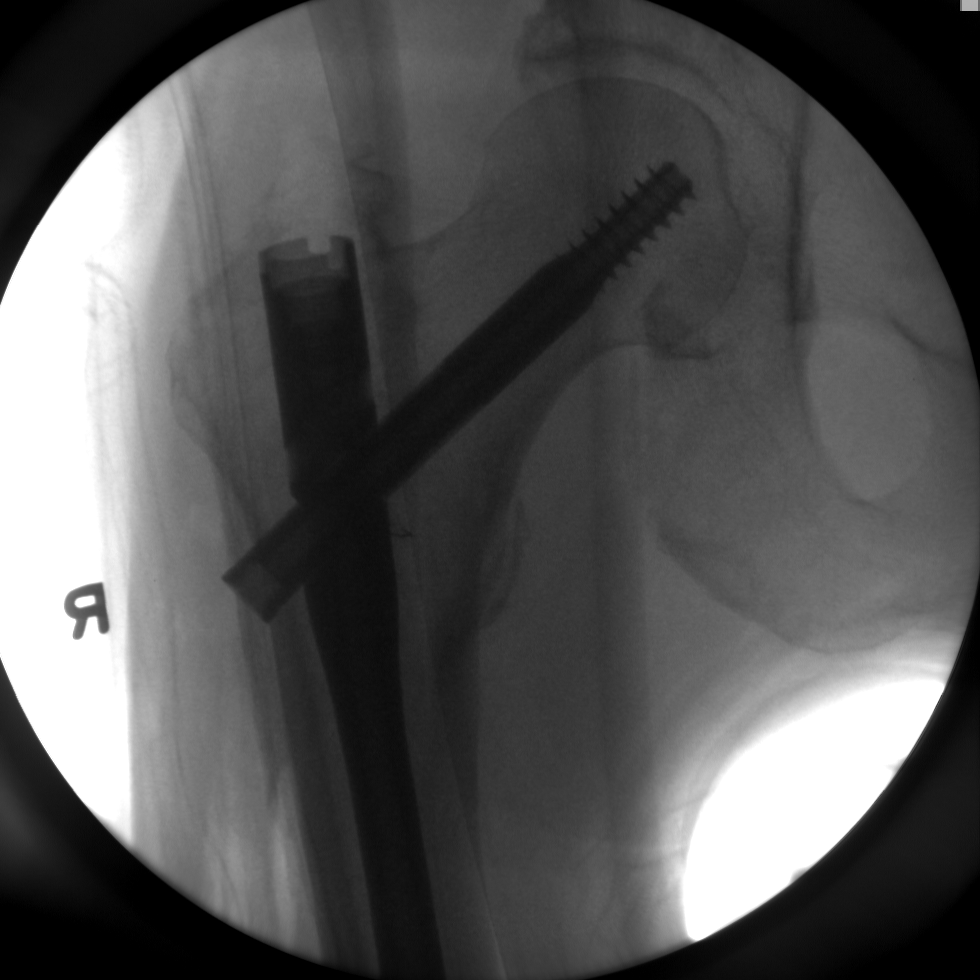
[im 2/4]
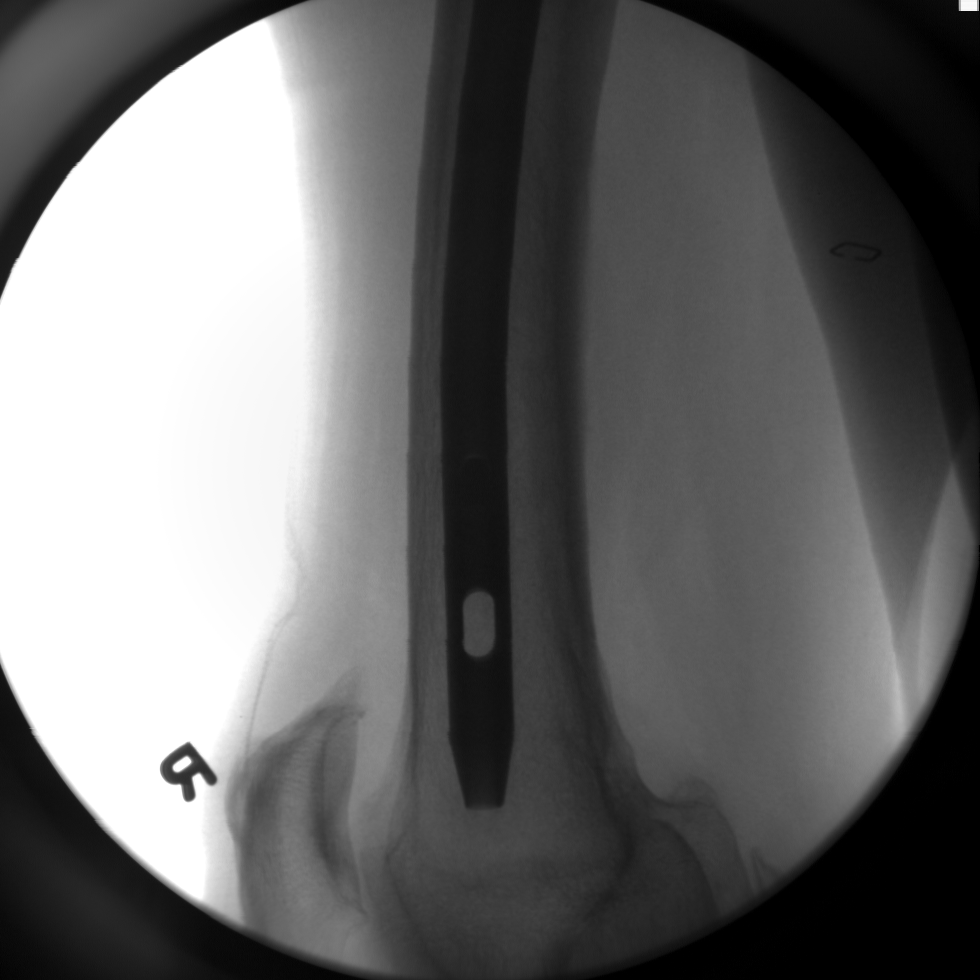
[im 3/4]
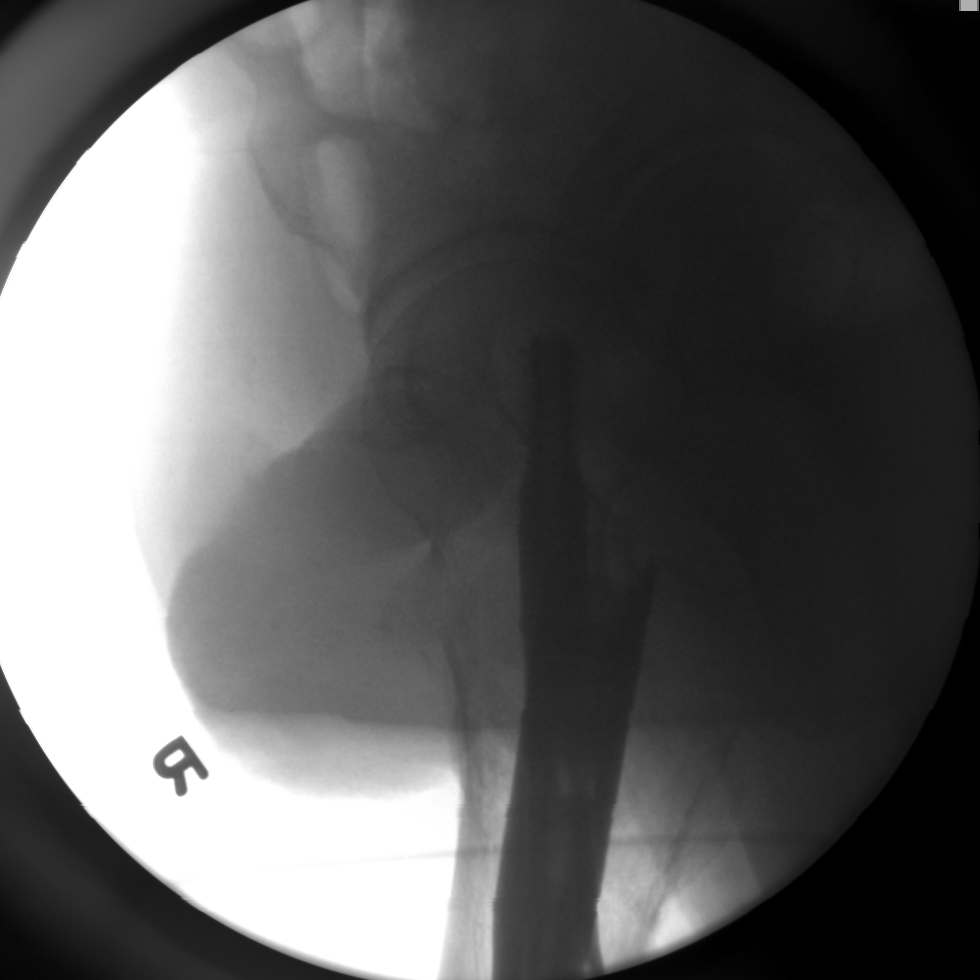
[im 4/4]
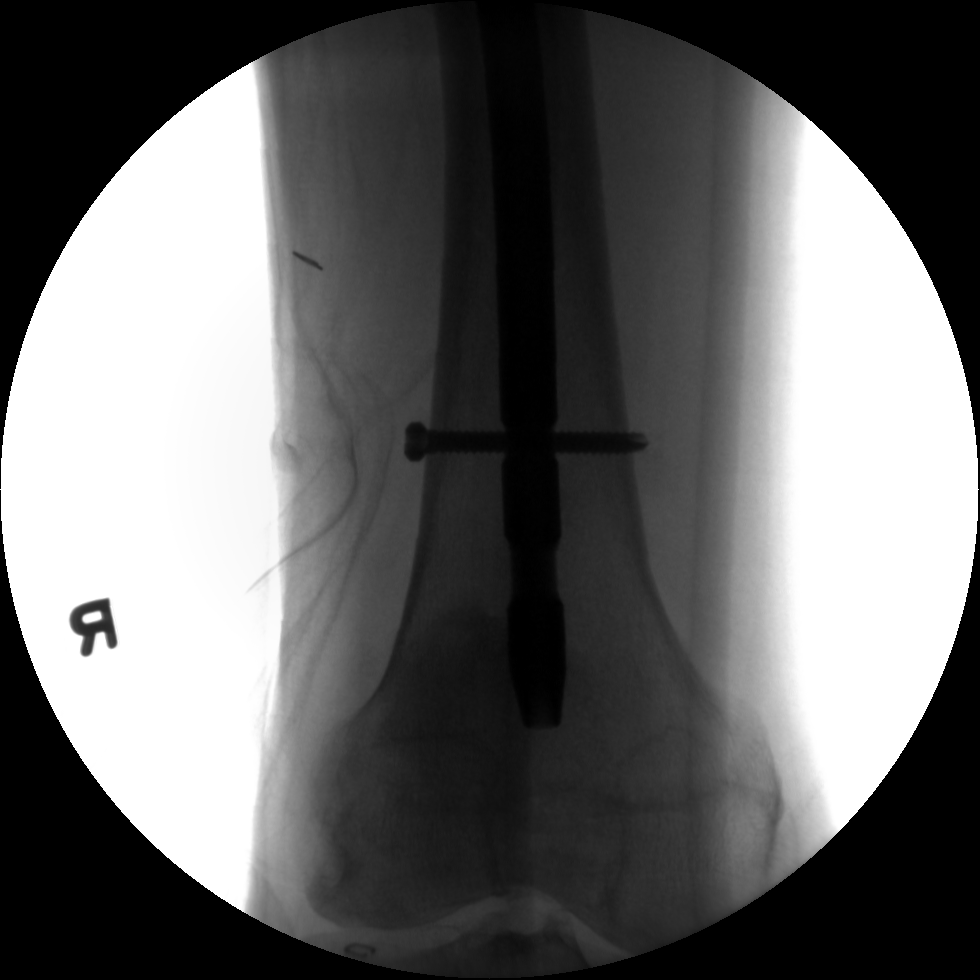

[4 of 4 positions shown; findings below may reference images not displayed]

FINDINGS: Interval placement of a right femoral intramedullary nail and
interlocking cannulated femoral neck screw transfixing a right
intertrochanteric fracture in near anatomic alignment. No failure or
complication. No dislocation.
IMPRESSION: ORIF right intertrochanteric fracture.

## 2016-04-17 IMAGING — CR DG FEMUR 2+V PORT*R*
1 series · 1 of 1 positions shown · non-contrast
Comparison: Portable exam 1188 hr compared to 04/23/2014

CLINICAL DATA: Post nailing of LEFT femur

EXAM:
PORTABLE RIGHT FEMUR - 2 VIEW

[AP]
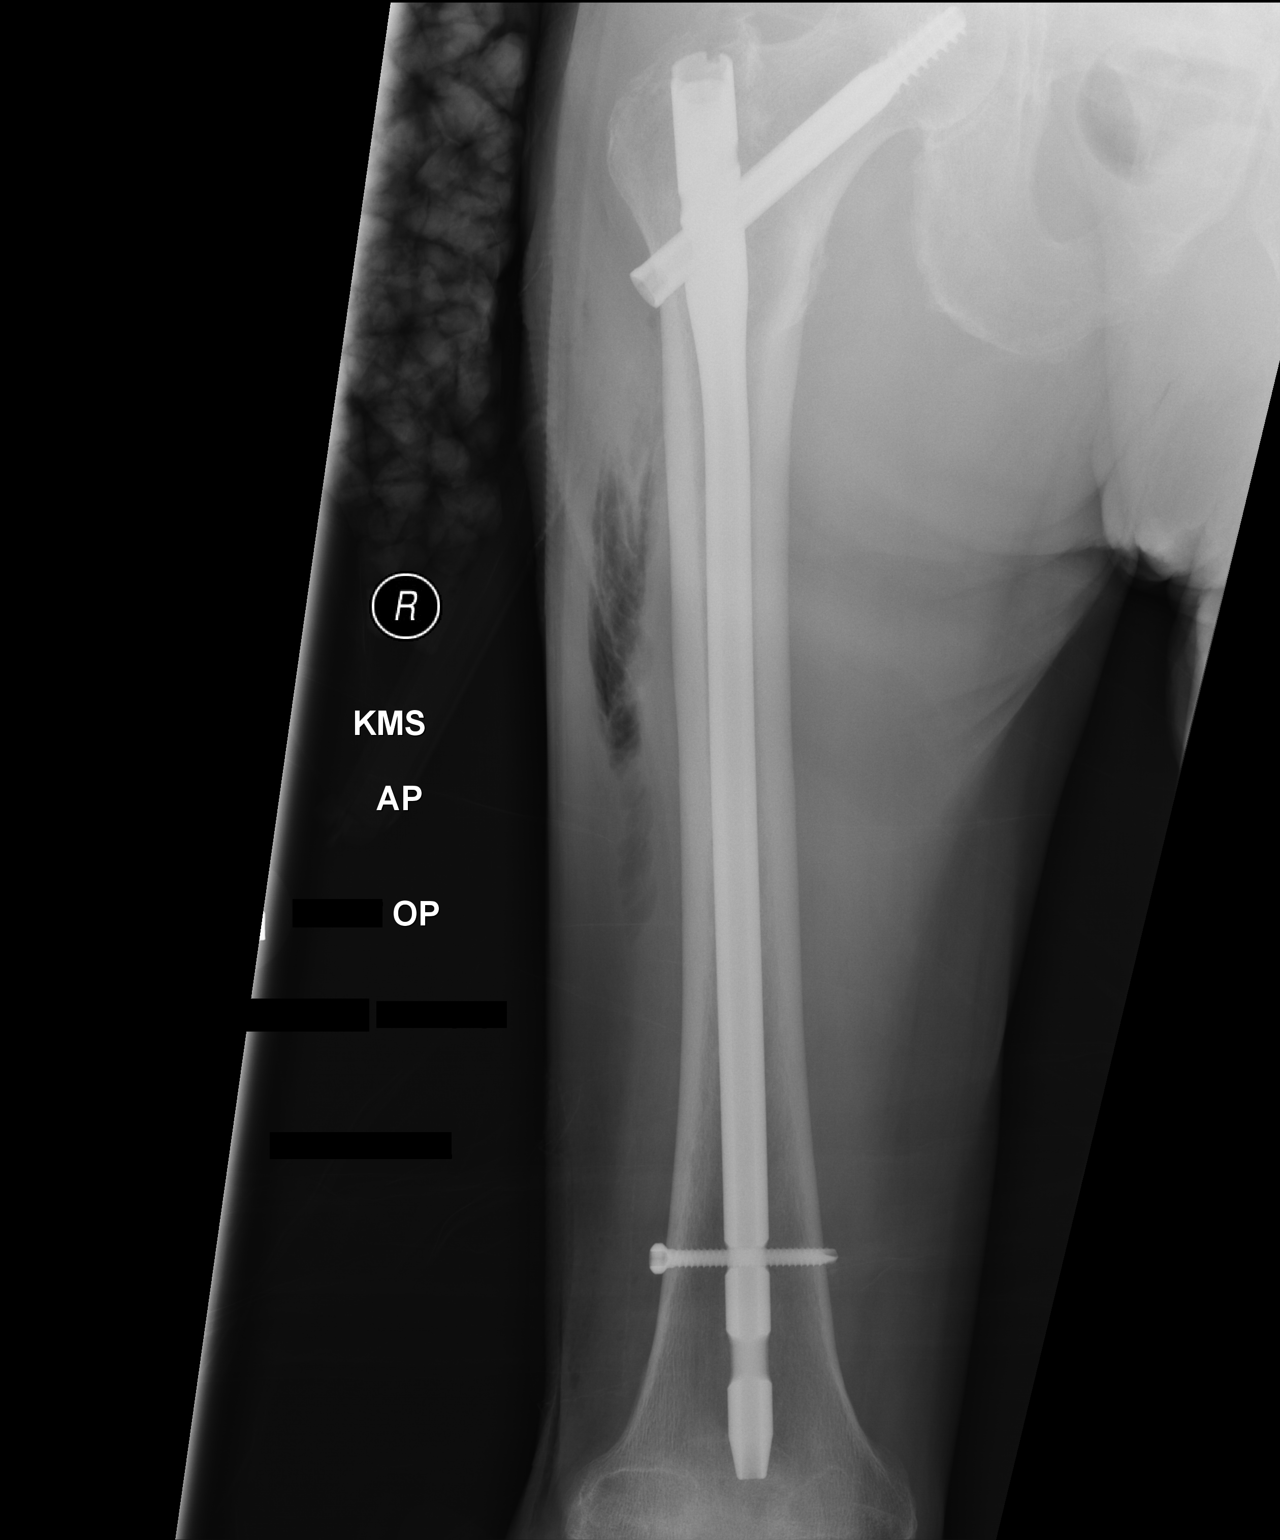

[1 of 1 positions shown; findings below may reference images not displayed]

FINDINGS: IM nail with compression screw identified aross a reduced
intertrochanteric fracture of the RIGHT femur.

Distal locking screw present at IM nail.

No additional fracture or dislocation.

Knee joint excluded.

Partial visualization of RIGHT hip joint showing no gross
dislocation.
IMPRESSION: Post nailing of intertrochanteric fracture RIGHT femur as above.

## 2017-04-06 DIAGNOSIS — D494 Neoplasm of unspecified behavior of bladder: Secondary | ICD-10-CM

## 2017-04-06 DIAGNOSIS — F419 Anxiety disorder, unspecified: Secondary | ICD-10-CM | POA: Diagnosis not present

## 2017-04-06 DIAGNOSIS — D72829 Elevated white blood cell count, unspecified: Secondary | ICD-10-CM | POA: Diagnosis not present

## 2017-04-06 DIAGNOSIS — F1120 Opioid dependence, uncomplicated: Secondary | ICD-10-CM | POA: Diagnosis not present

## 2017-04-06 DIAGNOSIS — R112 Nausea with vomiting, unspecified: Secondary | ICD-10-CM

## 2017-04-06 DIAGNOSIS — R109 Unspecified abdominal pain: Secondary | ICD-10-CM

## 2017-04-06 DIAGNOSIS — R319 Hematuria, unspecified: Secondary | ICD-10-CM | POA: Diagnosis not present

## 2017-04-07 DIAGNOSIS — D494 Neoplasm of unspecified behavior of bladder: Secondary | ICD-10-CM | POA: Diagnosis not present

## 2017-04-07 DIAGNOSIS — F1120 Opioid dependence, uncomplicated: Secondary | ICD-10-CM | POA: Diagnosis not present

## 2017-04-07 DIAGNOSIS — R319 Hematuria, unspecified: Secondary | ICD-10-CM | POA: Diagnosis not present

## 2017-04-07 DIAGNOSIS — R109 Unspecified abdominal pain: Secondary | ICD-10-CM | POA: Diagnosis not present

## 2017-04-07 DIAGNOSIS — D72829 Elevated white blood cell count, unspecified: Secondary | ICD-10-CM | POA: Diagnosis not present

## 2017-04-07 DIAGNOSIS — F419 Anxiety disorder, unspecified: Secondary | ICD-10-CM | POA: Diagnosis not present

## 2017-04-07 DIAGNOSIS — R112 Nausea with vomiting, unspecified: Secondary | ICD-10-CM | POA: Diagnosis not present

## 2017-04-08 DIAGNOSIS — R319 Hematuria, unspecified: Secondary | ICD-10-CM | POA: Diagnosis not present

## 2017-04-08 DIAGNOSIS — D72829 Elevated white blood cell count, unspecified: Secondary | ICD-10-CM | POA: Diagnosis not present

## 2017-04-08 DIAGNOSIS — F419 Anxiety disorder, unspecified: Secondary | ICD-10-CM | POA: Diagnosis not present

## 2017-04-08 DIAGNOSIS — D494 Neoplasm of unspecified behavior of bladder: Secondary | ICD-10-CM | POA: Diagnosis not present

## 2017-04-08 DIAGNOSIS — R112 Nausea with vomiting, unspecified: Secondary | ICD-10-CM | POA: Diagnosis not present

## 2017-04-08 DIAGNOSIS — R109 Unspecified abdominal pain: Secondary | ICD-10-CM | POA: Diagnosis not present

## 2017-04-08 DIAGNOSIS — F1120 Opioid dependence, uncomplicated: Secondary | ICD-10-CM | POA: Diagnosis not present

## 2018-04-02 DIAGNOSIS — I951 Orthostatic hypotension: Secondary | ICD-10-CM

## 2018-04-02 DIAGNOSIS — R319 Hematuria, unspecified: Secondary | ICD-10-CM

## 2018-04-02 DIAGNOSIS — E871 Hypo-osmolality and hyponatremia: Secondary | ICD-10-CM

## 2018-04-02 DIAGNOSIS — F419 Anxiety disorder, unspecified: Secondary | ICD-10-CM

## 2018-04-02 DIAGNOSIS — D62 Acute posthemorrhagic anemia: Secondary | ICD-10-CM | POA: Diagnosis not present

## 2018-04-03 DIAGNOSIS — E871 Hypo-osmolality and hyponatremia: Secondary | ICD-10-CM | POA: Diagnosis not present

## 2018-04-03 DIAGNOSIS — D62 Acute posthemorrhagic anemia: Secondary | ICD-10-CM | POA: Diagnosis not present

## 2018-04-03 DIAGNOSIS — I951 Orthostatic hypotension: Secondary | ICD-10-CM | POA: Diagnosis not present

## 2018-04-03 DIAGNOSIS — R319 Hematuria, unspecified: Secondary | ICD-10-CM | POA: Diagnosis not present

## 2018-04-04 DIAGNOSIS — E871 Hypo-osmolality and hyponatremia: Secondary | ICD-10-CM | POA: Diagnosis not present

## 2018-04-04 DIAGNOSIS — R319 Hematuria, unspecified: Secondary | ICD-10-CM | POA: Diagnosis not present

## 2018-04-04 DIAGNOSIS — D62 Acute posthemorrhagic anemia: Secondary | ICD-10-CM | POA: Diagnosis not present

## 2018-04-04 DIAGNOSIS — I951 Orthostatic hypotension: Secondary | ICD-10-CM | POA: Diagnosis not present

## 2018-04-05 DIAGNOSIS — E871 Hypo-osmolality and hyponatremia: Secondary | ICD-10-CM | POA: Diagnosis not present

## 2018-04-05 DIAGNOSIS — I951 Orthostatic hypotension: Secondary | ICD-10-CM | POA: Diagnosis not present

## 2018-04-05 DIAGNOSIS — R55 Syncope and collapse: Secondary | ICD-10-CM

## 2018-04-05 DIAGNOSIS — R319 Hematuria, unspecified: Secondary | ICD-10-CM | POA: Diagnosis not present

## 2018-04-05 DIAGNOSIS — D62 Acute posthemorrhagic anemia: Secondary | ICD-10-CM | POA: Diagnosis not present

## 2018-04-06 DIAGNOSIS — E871 Hypo-osmolality and hyponatremia: Secondary | ICD-10-CM | POA: Diagnosis not present

## 2018-04-06 DIAGNOSIS — R748 Abnormal levels of other serum enzymes: Secondary | ICD-10-CM

## 2018-04-06 DIAGNOSIS — Z01818 Encounter for other preprocedural examination: Secondary | ICD-10-CM

## 2018-04-06 DIAGNOSIS — D62 Acute posthemorrhagic anemia: Secondary | ICD-10-CM | POA: Diagnosis not present

## 2018-04-06 DIAGNOSIS — I951 Orthostatic hypotension: Secondary | ICD-10-CM | POA: Diagnosis not present

## 2018-04-06 DIAGNOSIS — R55 Syncope and collapse: Secondary | ICD-10-CM | POA: Diagnosis not present

## 2018-04-06 DIAGNOSIS — R319 Hematuria, unspecified: Secondary | ICD-10-CM | POA: Diagnosis not present

## 2018-04-07 DIAGNOSIS — R55 Syncope and collapse: Secondary | ICD-10-CM | POA: Diagnosis not present

## 2018-04-07 DIAGNOSIS — I951 Orthostatic hypotension: Secondary | ICD-10-CM | POA: Diagnosis not present

## 2018-04-07 DIAGNOSIS — R748 Abnormal levels of other serum enzymes: Secondary | ICD-10-CM | POA: Diagnosis not present

## 2018-04-07 DIAGNOSIS — D62 Acute posthemorrhagic anemia: Secondary | ICD-10-CM | POA: Diagnosis not present

## 2018-04-07 DIAGNOSIS — E871 Hypo-osmolality and hyponatremia: Secondary | ICD-10-CM | POA: Diagnosis not present

## 2018-04-07 DIAGNOSIS — R319 Hematuria, unspecified: Secondary | ICD-10-CM | POA: Diagnosis not present

## 2018-04-07 DIAGNOSIS — Z01818 Encounter for other preprocedural examination: Secondary | ICD-10-CM | POA: Diagnosis not present

## 2018-04-08 DIAGNOSIS — E871 Hypo-osmolality and hyponatremia: Secondary | ICD-10-CM | POA: Diagnosis not present

## 2018-04-08 DIAGNOSIS — Z01818 Encounter for other preprocedural examination: Secondary | ICD-10-CM | POA: Diagnosis not present

## 2018-04-08 DIAGNOSIS — R319 Hematuria, unspecified: Secondary | ICD-10-CM | POA: Diagnosis not present

## 2018-04-08 DIAGNOSIS — D62 Acute posthemorrhagic anemia: Secondary | ICD-10-CM | POA: Diagnosis not present

## 2018-04-08 DIAGNOSIS — I951 Orthostatic hypotension: Secondary | ICD-10-CM | POA: Diagnosis not present

## 2018-04-08 DIAGNOSIS — R748 Abnormal levels of other serum enzymes: Secondary | ICD-10-CM | POA: Diagnosis not present

## 2018-04-08 DIAGNOSIS — R55 Syncope and collapse: Secondary | ICD-10-CM | POA: Diagnosis not present

## 2018-04-09 DIAGNOSIS — I951 Orthostatic hypotension: Secondary | ICD-10-CM | POA: Diagnosis not present

## 2018-04-09 DIAGNOSIS — E871 Hypo-osmolality and hyponatremia: Secondary | ICD-10-CM | POA: Diagnosis not present

## 2018-04-09 DIAGNOSIS — R319 Hematuria, unspecified: Secondary | ICD-10-CM | POA: Diagnosis not present

## 2018-04-09 DIAGNOSIS — D62 Acute posthemorrhagic anemia: Secondary | ICD-10-CM | POA: Diagnosis not present

## 2018-04-10 DIAGNOSIS — E871 Hypo-osmolality and hyponatremia: Secondary | ICD-10-CM | POA: Diagnosis not present

## 2018-04-10 DIAGNOSIS — A419 Sepsis, unspecified organism: Secondary | ICD-10-CM | POA: Diagnosis not present

## 2018-04-10 DIAGNOSIS — I951 Orthostatic hypotension: Secondary | ICD-10-CM | POA: Diagnosis not present

## 2018-04-10 DIAGNOSIS — I350 Nonrheumatic aortic (valve) stenosis: Secondary | ICD-10-CM

## 2018-04-10 DIAGNOSIS — R319 Hematuria, unspecified: Secondary | ICD-10-CM | POA: Diagnosis not present

## 2018-04-10 DIAGNOSIS — D62 Acute posthemorrhagic anemia: Secondary | ICD-10-CM | POA: Diagnosis not present

## 2018-04-11 DIAGNOSIS — D62 Acute posthemorrhagic anemia: Secondary | ICD-10-CM | POA: Diagnosis not present

## 2018-04-11 DIAGNOSIS — R319 Hematuria, unspecified: Secondary | ICD-10-CM | POA: Diagnosis not present

## 2018-04-11 DIAGNOSIS — N3289 Other specified disorders of bladder: Secondary | ICD-10-CM

## 2018-04-12 DIAGNOSIS — N3289 Other specified disorders of bladder: Secondary | ICD-10-CM | POA: Diagnosis not present

## 2018-04-12 DIAGNOSIS — R319 Hematuria, unspecified: Secondary | ICD-10-CM | POA: Diagnosis not present

## 2018-04-12 DIAGNOSIS — D62 Acute posthemorrhagic anemia: Secondary | ICD-10-CM | POA: Diagnosis not present

## 2018-04-13 DIAGNOSIS — N3289 Other specified disorders of bladder: Secondary | ICD-10-CM | POA: Diagnosis not present

## 2018-04-13 DIAGNOSIS — D62 Acute posthemorrhagic anemia: Secondary | ICD-10-CM | POA: Diagnosis not present

## 2018-04-13 DIAGNOSIS — R319 Hematuria, unspecified: Secondary | ICD-10-CM | POA: Diagnosis not present

## 2018-04-14 DIAGNOSIS — R319 Hematuria, unspecified: Secondary | ICD-10-CM | POA: Diagnosis not present

## 2018-04-14 DIAGNOSIS — N3289 Other specified disorders of bladder: Secondary | ICD-10-CM | POA: Diagnosis not present

## 2018-04-14 DIAGNOSIS — D62 Acute posthemorrhagic anemia: Secondary | ICD-10-CM | POA: Diagnosis not present

## 2018-04-15 DIAGNOSIS — N3289 Other specified disorders of bladder: Secondary | ICD-10-CM | POA: Diagnosis not present

## 2018-04-15 DIAGNOSIS — R319 Hematuria, unspecified: Secondary | ICD-10-CM | POA: Diagnosis not present

## 2018-04-15 DIAGNOSIS — D62 Acute posthemorrhagic anemia: Secondary | ICD-10-CM | POA: Diagnosis not present

## 2018-04-16 DIAGNOSIS — D62 Acute posthemorrhagic anemia: Secondary | ICD-10-CM | POA: Diagnosis not present

## 2018-04-16 DIAGNOSIS — I509 Heart failure, unspecified: Secondary | ICD-10-CM | POA: Diagnosis not present

## 2018-04-16 DIAGNOSIS — N3289 Other specified disorders of bladder: Secondary | ICD-10-CM | POA: Diagnosis not present

## 2018-04-16 DIAGNOSIS — R319 Hematuria, unspecified: Secondary | ICD-10-CM | POA: Diagnosis not present

## 2018-04-16 DIAGNOSIS — R55 Syncope and collapse: Secondary | ICD-10-CM

## 2018-04-16 DIAGNOSIS — R7881 Bacteremia: Secondary | ICD-10-CM

## 2018-05-04 DIAGNOSIS — F1120 Opioid dependence, uncomplicated: Secondary | ICD-10-CM

## 2018-05-04 DIAGNOSIS — R55 Syncope and collapse: Secondary | ICD-10-CM | POA: Diagnosis not present

## 2018-05-04 DIAGNOSIS — D62 Acute posthemorrhagic anemia: Secondary | ICD-10-CM

## 2018-05-04 DIAGNOSIS — E871 Hypo-osmolality and hyponatremia: Secondary | ICD-10-CM

## 2018-05-05 DIAGNOSIS — E871 Hypo-osmolality and hyponatremia: Secondary | ICD-10-CM | POA: Diagnosis not present

## 2018-05-05 DIAGNOSIS — D62 Acute posthemorrhagic anemia: Secondary | ICD-10-CM | POA: Diagnosis not present

## 2018-05-05 DIAGNOSIS — R55 Syncope and collapse: Secondary | ICD-10-CM | POA: Diagnosis not present

## 2018-05-05 DIAGNOSIS — F1120 Opioid dependence, uncomplicated: Secondary | ICD-10-CM | POA: Diagnosis not present

## 2018-05-31 DIAGNOSIS — C679 Malignant neoplasm of bladder, unspecified: Secondary | ICD-10-CM | POA: Diagnosis not present

## 2018-05-31 DIAGNOSIS — D494 Neoplasm of unspecified behavior of bladder: Secondary | ICD-10-CM | POA: Diagnosis not present

## 2018-05-31 DIAGNOSIS — N390 Urinary tract infection, site not specified: Secondary | ICD-10-CM | POA: Diagnosis not present

## 2018-05-31 DIAGNOSIS — R079 Chest pain, unspecified: Secondary | ICD-10-CM | POA: Diagnosis not present

## 2018-06-01 DIAGNOSIS — I5022 Chronic systolic (congestive) heart failure: Secondary | ICD-10-CM | POA: Diagnosis not present

## 2018-06-01 DIAGNOSIS — C679 Malignant neoplasm of bladder, unspecified: Secondary | ICD-10-CM | POA: Diagnosis not present

## 2018-06-01 DIAGNOSIS — R067 Sneezing: Secondary | ICD-10-CM

## 2018-06-01 DIAGNOSIS — R319 Hematuria, unspecified: Secondary | ICD-10-CM

## 2018-06-01 DIAGNOSIS — R079 Chest pain, unspecified: Secondary | ICD-10-CM | POA: Diagnosis not present

## 2018-06-01 DIAGNOSIS — I10 Essential (primary) hypertension: Secondary | ICD-10-CM | POA: Diagnosis not present

## 2018-06-01 DIAGNOSIS — I2510 Atherosclerotic heart disease of native coronary artery without angina pectoris: Secondary | ICD-10-CM

## 2018-06-24 DIAGNOSIS — N179 Acute kidney failure, unspecified: Secondary | ICD-10-CM

## 2018-06-24 DIAGNOSIS — I951 Orthostatic hypotension: Secondary | ICD-10-CM

## 2018-06-24 DIAGNOSIS — C679 Malignant neoplasm of bladder, unspecified: Secondary | ICD-10-CM

## 2018-06-24 DIAGNOSIS — F1120 Opioid dependence, uncomplicated: Secondary | ICD-10-CM

## 2018-06-24 DIAGNOSIS — R739 Hyperglycemia, unspecified: Secondary | ICD-10-CM

## 2018-06-24 DIAGNOSIS — I10 Essential (primary) hypertension: Secondary | ICD-10-CM

## 2018-06-24 DIAGNOSIS — E872 Acidosis: Secondary | ICD-10-CM

## 2018-06-24 DIAGNOSIS — A419 Sepsis, unspecified organism: Secondary | ICD-10-CM

## 2018-06-24 DIAGNOSIS — D649 Anemia, unspecified: Secondary | ICD-10-CM

## 2018-06-24 DIAGNOSIS — I5032 Chronic diastolic (congestive) heart failure: Secondary | ICD-10-CM

## 2018-06-24 DIAGNOSIS — D72829 Elevated white blood cell count, unspecified: Secondary | ICD-10-CM

## 2018-06-24 DIAGNOSIS — N390 Urinary tract infection, site not specified: Secondary | ICD-10-CM

## 2018-06-25 DIAGNOSIS — N390 Urinary tract infection, site not specified: Secondary | ICD-10-CM | POA: Diagnosis not present

## 2018-06-25 DIAGNOSIS — A419 Sepsis, unspecified organism: Secondary | ICD-10-CM | POA: Diagnosis not present

## 2018-06-25 DIAGNOSIS — N179 Acute kidney failure, unspecified: Secondary | ICD-10-CM | POA: Diagnosis not present

## 2018-06-25 DIAGNOSIS — I5042 Chronic combined systolic (congestive) and diastolic (congestive) heart failure: Secondary | ICD-10-CM

## 2018-06-25 DIAGNOSIS — D649 Anemia, unspecified: Secondary | ICD-10-CM | POA: Diagnosis not present

## 2018-06-26 DIAGNOSIS — I5042 Chronic combined systolic (congestive) and diastolic (congestive) heart failure: Secondary | ICD-10-CM | POA: Diagnosis not present

## 2018-06-26 DIAGNOSIS — D649 Anemia, unspecified: Secondary | ICD-10-CM | POA: Diagnosis not present

## 2018-06-26 DIAGNOSIS — N390 Urinary tract infection, site not specified: Secondary | ICD-10-CM | POA: Diagnosis not present

## 2018-06-26 DIAGNOSIS — R319 Hematuria, unspecified: Secondary | ICD-10-CM

## 2018-06-26 DIAGNOSIS — F1120 Opioid dependence, uncomplicated: Secondary | ICD-10-CM | POA: Diagnosis not present

## 2018-07-12 DIAGNOSIS — C679 Malignant neoplasm of bladder, unspecified: Secondary | ICD-10-CM

## 2018-07-12 DIAGNOSIS — I10 Essential (primary) hypertension: Secondary | ICD-10-CM

## 2018-07-12 DIAGNOSIS — G9340 Encephalopathy, unspecified: Secondary | ICD-10-CM | POA: Diagnosis not present

## 2018-07-12 DIAGNOSIS — I5042 Chronic combined systolic (congestive) and diastolic (congestive) heart failure: Secondary | ICD-10-CM

## 2018-07-12 DIAGNOSIS — C799 Secondary malignant neoplasm of unspecified site: Secondary | ICD-10-CM

## 2018-07-12 DIAGNOSIS — N179 Acute kidney failure, unspecified: Secondary | ICD-10-CM

## 2018-07-12 DIAGNOSIS — D649 Anemia, unspecified: Secondary | ICD-10-CM

## 2018-07-12 DIAGNOSIS — Z515 Encounter for palliative care: Secondary | ICD-10-CM

## 2018-07-12 DIAGNOSIS — N390 Urinary tract infection, site not specified: Secondary | ICD-10-CM

## 2018-07-13 DIAGNOSIS — N390 Urinary tract infection, site not specified: Secondary | ICD-10-CM | POA: Diagnosis not present

## 2018-07-13 DIAGNOSIS — C799 Secondary malignant neoplasm of unspecified site: Secondary | ICD-10-CM | POA: Diagnosis not present

## 2018-07-13 DIAGNOSIS — G9340 Encephalopathy, unspecified: Secondary | ICD-10-CM | POA: Diagnosis not present

## 2018-07-13 DIAGNOSIS — D649 Anemia, unspecified: Secondary | ICD-10-CM | POA: Diagnosis not present

## 2018-07-14 DIAGNOSIS — C799 Secondary malignant neoplasm of unspecified site: Secondary | ICD-10-CM | POA: Diagnosis not present

## 2018-07-14 DIAGNOSIS — N390 Urinary tract infection, site not specified: Secondary | ICD-10-CM | POA: Diagnosis not present

## 2018-07-14 DIAGNOSIS — G9340 Encephalopathy, unspecified: Secondary | ICD-10-CM | POA: Diagnosis not present

## 2018-07-14 DIAGNOSIS — D649 Anemia, unspecified: Secondary | ICD-10-CM | POA: Diagnosis not present

## 2018-08-10 DEATH — deceased
